# Patient Record
Sex: Male | Born: 1985 | Hispanic: No | Marital: Single | Smoking: Never smoker
Health system: Southern US, Community
[De-identification: ages and names within clinical notes are randomized; demographics above are authoritative.]

## PROBLEM LIST (undated history)

## (undated) DIAGNOSIS — K649 Unspecified hemorrhoids: Secondary | ICD-10-CM

## (undated) DIAGNOSIS — N200 Calculus of kidney: Secondary | ICD-10-CM

## (undated) DIAGNOSIS — Z87442 Personal history of urinary calculi: Secondary | ICD-10-CM

## (undated) DIAGNOSIS — G4730 Sleep apnea, unspecified: Secondary | ICD-10-CM

## (undated) DIAGNOSIS — M539 Dorsopathy, unspecified: Secondary | ICD-10-CM

## (undated) DIAGNOSIS — I10 Essential (primary) hypertension: Secondary | ICD-10-CM

## (undated) DIAGNOSIS — N183 Chronic kidney disease, stage 3 unspecified: Secondary | ICD-10-CM

## (undated) DIAGNOSIS — F329 Major depressive disorder, single episode, unspecified: Secondary | ICD-10-CM

## (undated) DIAGNOSIS — F909 Attention-deficit hyperactivity disorder, unspecified type: Secondary | ICD-10-CM

## (undated) DIAGNOSIS — M4316 Spondylolisthesis, lumbar region: Secondary | ICD-10-CM

## (undated) DIAGNOSIS — D649 Anemia, unspecified: Secondary | ICD-10-CM

## (undated) DIAGNOSIS — M533 Sacrococcygeal disorders, not elsewhere classified: Secondary | ICD-10-CM

## (undated) DIAGNOSIS — F32A Depression, unspecified: Secondary | ICD-10-CM

## (undated) DIAGNOSIS — M545 Low back pain, unspecified: Secondary | ICD-10-CM

## (undated) HISTORY — PX: MOLE REMOVAL: SHX2046

## (undated) HISTORY — DX: Calculus of kidney: N20.0

## (undated) HISTORY — DX: Dorsopathy, unspecified: M53.9

## (undated) HISTORY — PX: RHINOPLASTY: SUR1284

## (undated) HISTORY — PX: ORIF RADIUS & ULNA FRACTURES: SHX2129

## (undated) HISTORY — DX: Essential (primary) hypertension: I10

## (undated) HISTORY — PX: EXTRACORPOREAL SHOCK WAVE LITHOTRIPSY: SHX1557

---
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
## 2008-04-24 ENCOUNTER — Ambulatory Visit (HOSPITAL_COMMUNITY): Admission: RE | Admit: 2008-04-24 | Discharge: 2008-04-24 | Payer: Self-pay | Admitting: Orthopedic Surgery

## 2010-06-09 IMAGING — RF DG FLUORO GUIDE NDL PLC/BX
6 series · 6 of 6 positions shown · non-contrast
Comparison: No priors

CLINICAL DATA: Right shoulder pain - possible labral tear

FLUORO GUIDED NEEDLE PLACEMENT

[Series 1: run · 1 of 1 slices shown (1 of 6)]
[im 1/1]
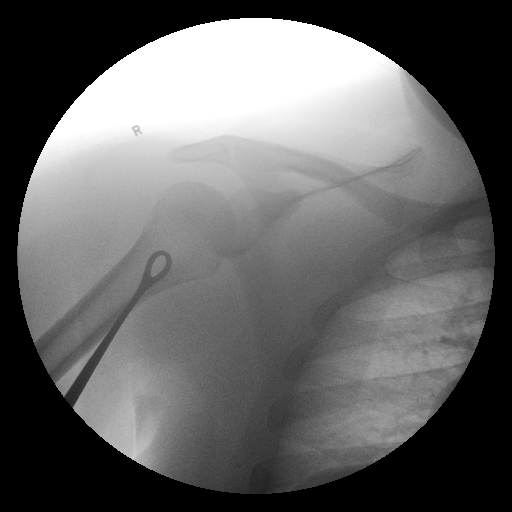

[Series 2: run · 1 of 1 slices shown (2 of 6)]
[im 1/1]
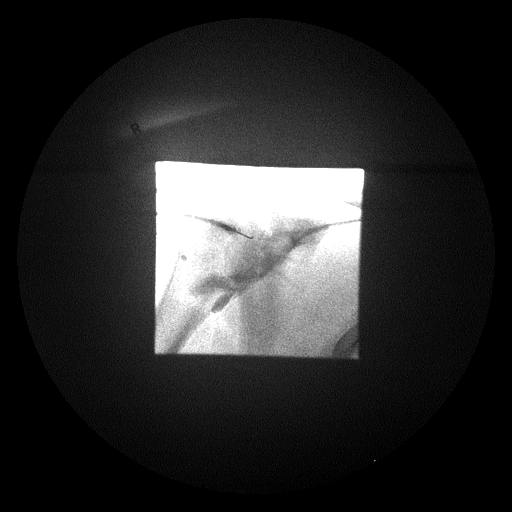

[Series 3: run · 1 of 1 slices shown (3 of 6)]
[im 1/1]
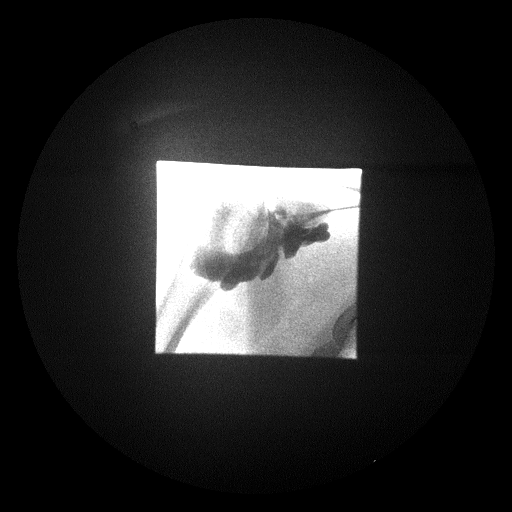

[Series 4: run · 1 of 1 slices shown (4 of 6)]
[im 1/1]
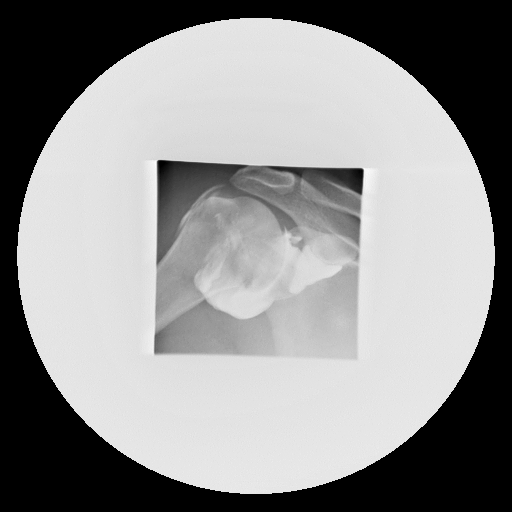

[Series 5: run · 1 of 1 slices shown (5 of 6)]
[im 1/1]
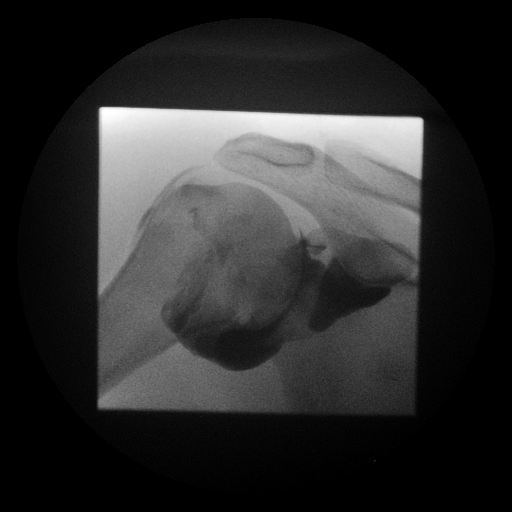

[Series 6: run · 1 of 1 slices shown (6 of 6)]
[im 1/1]
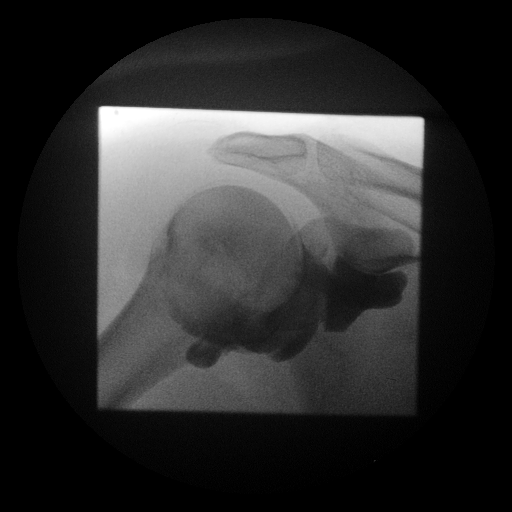

[6 of 6 positions shown; findings below may reference images not displayed]

FINDINGS: Under fluoroscopic guidance, the right shoulder was
injected for MRI.

Initially, the skin was prepped and draped in the usual fashion.
After local anesthesia, a 22 gauge spinal needle was advanced to
the right humeral head with fluoroscopic guidance.  Injection of a
small amount of contrast shows that the needle was not intra-
articular.  The needle was redirected more medially, achieving
intra-articular position, documented with contrast injection.

A total of approximately 10 ml of a mixture of Omnipaque and
lidocaine, containing 0.1 ml Gadolinium, was injected into the
joint.

Procedure well tolerated by the patient no immediate complications
encountered.
IMPRESSION: Right shoulder joint injection for MRI.

## 2010-11-01 HISTORY — PX: PILONIDAL CYST EXCISION: SHX744

## 2010-11-02 ENCOUNTER — Encounter (INDEPENDENT_AMBULATORY_CARE_PROVIDER_SITE_OTHER): Payer: PRIVATE HEALTH INSURANCE | Admitting: General Surgery

## 2010-11-02 ENCOUNTER — Telehealth (INDEPENDENT_AMBULATORY_CARE_PROVIDER_SITE_OTHER): Payer: Self-pay | Admitting: General Surgery

## 2010-11-02 NOTE — Telephone Encounter (Signed)
Please call patient @ [phone]. He had an appointment on 11/02/10 and was told he needed to reschedule. The first available appointment is in August and he feels that his appointment is post-op and should be sooner. Thank you.

## 2010-11-10 ENCOUNTER — Ambulatory Visit (INDEPENDENT_AMBULATORY_CARE_PROVIDER_SITE_OTHER): Payer: PRIVATE HEALTH INSURANCE | Admitting: General Surgery

## 2010-11-10 ENCOUNTER — Encounter (INDEPENDENT_AMBULATORY_CARE_PROVIDER_SITE_OTHER): Payer: Self-pay | Admitting: General Surgery

## 2010-11-10 DIAGNOSIS — L0591 Pilonidal cyst without abscess: Secondary | ICD-10-CM | POA: Insufficient documentation

## 2010-11-10 NOTE — Progress Notes (Signed)
He returns for another postoperative visit following extensive pilonidal cystectomy. He states there is a painful firm nodule at the superior aspect of the incision. He is wondering if he can go swimming in a pool.  Physical exam: The gluteal cleft wound is nearly completely healed and is very shallow and clean. There is a firm nodular scar present superiorly which looks like granulation tissue. I applied silver nitrate to this area and he tolerated it well  Assessment  :Wound healing well following extensive pilonidal cystectomy. Granulation tissue treated.  Plan: Continue current wound care. I think he going to assuming pool now. If the nodule is still bothers him in a couple of months call back for a return visit. Otherwise return p.r.n.

## 2011-03-01 DIAGNOSIS — I10 Essential (primary) hypertension: Secondary | ICD-10-CM | POA: Diagnosis present

## 2011-03-31 DIAGNOSIS — E785 Hyperlipidemia, unspecified: Secondary | ICD-10-CM | POA: Insufficient documentation

## 2012-05-03 DIAGNOSIS — G4733 Obstructive sleep apnea (adult) (pediatric): Secondary | ICD-10-CM

## 2012-05-03 HISTORY — DX: Obstructive sleep apnea (adult) (pediatric): G47.33

## 2012-10-31 DIAGNOSIS — Z9989 Dependence on other enabling machines and devices: Secondary | ICD-10-CM

## 2012-10-31 DIAGNOSIS — G4733 Obstructive sleep apnea (adult) (pediatric): Secondary | ICD-10-CM

## 2013-01-19 DIAGNOSIS — M545 Low back pain, unspecified: Secondary | ICD-10-CM | POA: Insufficient documentation

## 2014-10-16 DIAGNOSIS — F988 Other specified behavioral and emotional disorders with onset usually occurring in childhood and adolescence: Secondary | ICD-10-CM | POA: Insufficient documentation

## 2015-07-07 DIAGNOSIS — F411 Generalized anxiety disorder: Secondary | ICD-10-CM | POA: Insufficient documentation

## 2016-02-05 DIAGNOSIS — N200 Calculus of kidney: Secondary | ICD-10-CM | POA: Insufficient documentation

## 2016-02-05 DIAGNOSIS — K760 Fatty (change of) liver, not elsewhere classified: Secondary | ICD-10-CM | POA: Insufficient documentation

## 2017-03-18 HISTORY — PX: POSTERIOR LUMBAR FUSION: SHX6036

## 2017-06-14 ENCOUNTER — Encounter (INDEPENDENT_AMBULATORY_CARE_PROVIDER_SITE_OTHER): Payer: Self-pay | Admitting: Orthopaedic Surgery

## 2017-06-14 ENCOUNTER — Other Ambulatory Visit (INDEPENDENT_AMBULATORY_CARE_PROVIDER_SITE_OTHER): Payer: Self-pay | Admitting: Orthopaedic Surgery

## 2017-06-15 ENCOUNTER — Encounter (INDEPENDENT_AMBULATORY_CARE_PROVIDER_SITE_OTHER): Payer: Self-pay | Admitting: Orthopaedic Surgery

## 2018-05-19 DIAGNOSIS — M5126 Other intervertebral disc displacement, lumbar region: Secondary | ICD-10-CM | POA: Insufficient documentation

## 2018-07-25 ENCOUNTER — Other Ambulatory Visit (INDEPENDENT_AMBULATORY_CARE_PROVIDER_SITE_OTHER): Payer: Self-pay | Admitting: Orthopaedic Surgery

## 2019-08-20 DIAGNOSIS — I10 Essential (primary) hypertension: Secondary | ICD-10-CM | POA: Insufficient documentation

## 2019-08-20 DIAGNOSIS — Z981 Arthrodesis status: Secondary | ICD-10-CM | POA: Insufficient documentation

## 2019-08-20 DIAGNOSIS — F329 Major depressive disorder, single episode, unspecified: Secondary | ICD-10-CM | POA: Insufficient documentation

## 2019-08-20 DIAGNOSIS — G629 Polyneuropathy, unspecified: Secondary | ICD-10-CM | POA: Insufficient documentation

## 2019-08-20 DIAGNOSIS — F32A Depression, unspecified: Secondary | ICD-10-CM | POA: Insufficient documentation

## 2019-12-06 DIAGNOSIS — F3342 Major depressive disorder, recurrent, in full remission: Secondary | ICD-10-CM | POA: Diagnosis not present

## 2019-12-06 DIAGNOSIS — F902 Attention-deficit hyperactivity disorder, combined type: Secondary | ICD-10-CM | POA: Diagnosis not present

## 2019-12-11 DIAGNOSIS — G894 Chronic pain syndrome: Secondary | ICD-10-CM | POA: Diagnosis not present

## 2019-12-11 DIAGNOSIS — M542 Cervicalgia: Secondary | ICD-10-CM | POA: Diagnosis not present

## 2019-12-11 DIAGNOSIS — M4316 Spondylolisthesis, lumbar region: Secondary | ICD-10-CM | POA: Diagnosis not present

## 2019-12-11 DIAGNOSIS — M5416 Radiculopathy, lumbar region: Secondary | ICD-10-CM | POA: Diagnosis not present

## 2020-01-14 MED FILL — traMADol HCL 50 MG TABS: 50 | 30 days supply | Qty: 180 | Fill #0

## 2020-01-15 ENCOUNTER — Other Ambulatory Visit (HOSPITAL_BASED_OUTPATIENT_CLINIC_OR_DEPARTMENT_OTHER): Payer: Self-pay | Admitting: Internal Medicine

## 2020-01-15 MED FILL — LISINOPRIL 40 MG TABS: 40 | 90 days supply | Qty: 90 | Fill #0

## 2020-01-18 MED FILL — CONCERTA 36 MG TABLET ER: 36 | 30 days supply | Qty: 30 | Fill #0

## 2020-01-18 MED FILL — buPROPion HCL ER (XL) 300 M: 300 | 90 days supply | Qty: 90 | Fill #0

## 2020-01-18 MED FILL — METHYLPHENIDATE HCL 5 MG TA: 5 | 30 days supply | Qty: 30 | Fill #0

## 2020-02-28 DIAGNOSIS — F3342 Major depressive disorder, recurrent, in full remission: Secondary | ICD-10-CM | POA: Diagnosis not present

## 2020-02-28 DIAGNOSIS — F902 Attention-deficit hyperactivity disorder, combined type: Secondary | ICD-10-CM | POA: Diagnosis not present

## 2020-02-29 ENCOUNTER — Other Ambulatory Visit (HOSPITAL_BASED_OUTPATIENT_CLINIC_OR_DEPARTMENT_OTHER): Payer: Self-pay | Admitting: Psychiatry

## 2020-02-29 MED FILL — METHYLPHENIDATE HCL 5 MG TA: 5 | 30 days supply | Qty: 30 | Fill #0

## 2020-02-29 MED FILL — CONCERTA 36 MG TABLET ER: 36 | 30 days supply | Qty: 30 | Fill #0

## 2020-03-02 DIAGNOSIS — R110 Nausea: Secondary | ICD-10-CM | POA: Diagnosis not present

## 2020-03-02 DIAGNOSIS — R160 Hepatomegaly, not elsewhere classified: Secondary | ICD-10-CM | POA: Diagnosis not present

## 2020-03-02 DIAGNOSIS — N200 Calculus of kidney: Secondary | ICD-10-CM | POA: Diagnosis not present

## 2020-03-02 DIAGNOSIS — K760 Fatty (change of) liver, not elsewhere classified: Secondary | ICD-10-CM | POA: Diagnosis not present

## 2020-03-02 DIAGNOSIS — R10813 Right lower quadrant abdominal tenderness: Secondary | ICD-10-CM | POA: Diagnosis not present

## 2020-03-11 ENCOUNTER — Other Ambulatory Visit (HOSPITAL_BASED_OUTPATIENT_CLINIC_OR_DEPARTMENT_OTHER): Payer: Self-pay | Admitting: Physician Assistant

## 2020-03-11 DIAGNOSIS — M4316 Spondylolisthesis, lumbar region: Secondary | ICD-10-CM | POA: Diagnosis not present

## 2020-03-11 DIAGNOSIS — G894 Chronic pain syndrome: Secondary | ICD-10-CM | POA: Diagnosis not present

## 2020-03-11 DIAGNOSIS — Z5181 Encounter for therapeutic drug level monitoring: Secondary | ICD-10-CM | POA: Diagnosis not present

## 2020-03-11 DIAGNOSIS — M542 Cervicalgia: Secondary | ICD-10-CM | POA: Diagnosis not present

## 2020-03-11 DIAGNOSIS — Z79899 Other long term (current) drug therapy: Secondary | ICD-10-CM | POA: Diagnosis not present

## 2020-03-11 DIAGNOSIS — M961 Postlaminectomy syndrome, not elsewhere classified: Secondary | ICD-10-CM | POA: Diagnosis not present

## 2020-03-11 MED FILL — tiZANidine HCL 4 MG TABS: 4 | 30 days supply | Qty: 90 | Fill #0

## 2020-03-11 MED FILL — traMADol HCL 50 MG TABS: 50 | 30 days supply | Qty: 180 | Fill #0

## 2020-03-11 MED FILL — GABAPENTIN 300 MG CAPSULE: 300 | 90 days supply | Qty: 270 | Fill #0

## 2020-03-11 MED FILL — HYDROCODON-APAP 5-325: 5-325 | 15 days supply | Qty: 15 | Fill #0

## 2020-03-18 DIAGNOSIS — N200 Calculus of kidney: Secondary | ICD-10-CM | POA: Diagnosis not present

## 2020-03-18 MED FILL — CONCERTA 36 MG TABLET ER: 36 | 30 days supply | Qty: 30 | Fill #0

## 2020-03-25 DIAGNOSIS — N200 Calculus of kidney: Secondary | ICD-10-CM | POA: Diagnosis not present

## 2020-04-01 MED FILL — FENOFIBRATE 160 MG TABLET: 160 | 90 days supply | Qty: 90 | Fill #0

## 2020-04-14 MED FILL — traMADol HCL 50 MG TABS: 50 | 30 days supply | Qty: 180 | Fill #1

## 2020-04-14 MED FILL — CONCERTA 36 MG TABLET ER: 36 | 30 days supply | Qty: 30 | Fill #0

## 2020-04-14 MED FILL — tiZANidine HCL 4 MG TABS: 4 | 30 days supply | Qty: 90 | Fill #1

## 2020-04-14 MED FILL — LISINOPRIL 40 MG TABS: 40 | 90 days supply | Qty: 90 | Fill #1

## 2020-05-02 ENCOUNTER — Other Ambulatory Visit (HOSPITAL_BASED_OUTPATIENT_CLINIC_OR_DEPARTMENT_OTHER): Payer: Self-pay

## 2020-05-02 MED FILL — buPROPion HCL ER (XL) 300 M: 300 | 90 days supply | Qty: 90 | Fill #0

## 2020-05-22 ENCOUNTER — Other Ambulatory Visit (HOSPITAL_BASED_OUTPATIENT_CLINIC_OR_DEPARTMENT_OTHER): Payer: Self-pay

## 2020-05-22 DIAGNOSIS — F902 Attention-deficit hyperactivity disorder, combined type: Secondary | ICD-10-CM | POA: Diagnosis not present

## 2020-05-22 DIAGNOSIS — F3342 Major depressive disorder, recurrent, in full remission: Secondary | ICD-10-CM | POA: Diagnosis not present

## 2020-05-22 MED FILL — traMADol HCL 50 MG TABS: 50 | 30 days supply | Qty: 180 | Fill #2

## 2020-05-22 MED FILL — tiZANidine HCL 4 MG TABS: 4 | 30 days supply | Qty: 90 | Fill #2

## 2020-05-23 ENCOUNTER — Other Ambulatory Visit (HOSPITAL_BASED_OUTPATIENT_CLINIC_OR_DEPARTMENT_OTHER): Payer: Self-pay | Admitting: Psychiatry

## 2020-05-23 MED FILL — METHYLPHENIDATE HCL 5 MG TA: 5 | 30 days supply | Qty: 30 | Fill #0

## 2020-05-23 MED FILL — CONCERTA 36 MG TABLET ER: 36 | 30 days supply | Qty: 30 | Fill #0

## 2020-06-06 ENCOUNTER — Other Ambulatory Visit (HOSPITAL_BASED_OUTPATIENT_CLINIC_OR_DEPARTMENT_OTHER): Payer: Self-pay | Admitting: Physician Assistant

## 2020-06-06 DIAGNOSIS — M961 Postlaminectomy syndrome, not elsewhere classified: Secondary | ICD-10-CM | POA: Diagnosis not present

## 2020-06-06 DIAGNOSIS — Z981 Arthrodesis status: Secondary | ICD-10-CM | POA: Diagnosis not present

## 2020-06-06 DIAGNOSIS — M5416 Radiculopathy, lumbar region: Secondary | ICD-10-CM | POA: Diagnosis not present

## 2020-06-06 DIAGNOSIS — M542 Cervicalgia: Secondary | ICD-10-CM | POA: Diagnosis not present

## 2020-06-06 DIAGNOSIS — M4316 Spondylolisthesis, lumbar region: Secondary | ICD-10-CM | POA: Diagnosis not present

## 2020-06-06 DIAGNOSIS — M533 Sacrococcygeal disorders, not elsewhere classified: Secondary | ICD-10-CM | POA: Diagnosis not present

## 2020-06-06 MED FILL — HYDROCODON-APAP 5-325: 5-325 | 15 days supply | Qty: 15 | Fill #0

## 2020-06-16 MED FILL — GABAPENTIN 300 MG CAPSULE: 300 | 90 days supply | Qty: 270 | Fill #1

## 2020-06-20 MED FILL — tiZANidine HCL 4 MG TABS: 4 | 30 days supply | Qty: 90 | Fill #0

## 2020-06-20 MED FILL — traMADol HCL 50 MG TABS: 50 | 30 days supply | Qty: 180 | Fill #0

## 2020-06-30 MED FILL — CONCERTA 36 MG TABLET ER: 36 | 30 days supply | Qty: 30 | Fill #0

## 2020-07-03 ENCOUNTER — Other Ambulatory Visit (HOSPITAL_BASED_OUTPATIENT_CLINIC_OR_DEPARTMENT_OTHER): Payer: Self-pay | Admitting: Internal Medicine

## 2020-07-03 MED FILL — FENOFIBRATE 160 MG TABLET: 160 | 30 days supply | Qty: 30 | Fill #0

## 2020-07-28 MED FILL — CONCERTA 36 MG TABLET ER: 36 | 30 days supply | Qty: 30 | Fill #0

## 2020-07-28 MED FILL — traMADol HCL 50 MG TABS: 50 | 30 days supply | Qty: 180 | Fill #1

## 2020-07-28 MED FILL — buPROPion HCL ER (XL) 300 M: 300 | 90 days supply | Qty: 90 | Fill #0

## 2020-07-28 MED FILL — tiZANidine HCL 4 MG TABS: 4 | 30 days supply | Qty: 90 | Fill #1

## 2020-08-05 ENCOUNTER — Other Ambulatory Visit (HOSPITAL_BASED_OUTPATIENT_CLINIC_OR_DEPARTMENT_OTHER): Payer: Self-pay

## 2020-08-05 DIAGNOSIS — R7309 Other abnormal glucose: Secondary | ICD-10-CM | POA: Diagnosis not present

## 2020-08-05 DIAGNOSIS — E782 Mixed hyperlipidemia: Secondary | ICD-10-CM | POA: Diagnosis not present

## 2020-08-05 DIAGNOSIS — Z0000 Encounter for general adult medical examination without abnormal findings: Secondary | ICD-10-CM | POA: Diagnosis not present

## 2020-08-05 DIAGNOSIS — Z6841 Body Mass Index (BMI) 40.0 and over, adult: Secondary | ICD-10-CM | POA: Diagnosis not present

## 2020-08-05 DIAGNOSIS — I10 Essential (primary) hypertension: Secondary | ICD-10-CM | POA: Diagnosis not present

## 2020-08-05 DIAGNOSIS — K760 Fatty (change of) liver, not elsewhere classified: Secondary | ICD-10-CM | POA: Diagnosis not present

## 2020-08-05 MED ORDER — HYDROCHLOROTHIAZIDE 25 MG PO TABS
ORAL_TABLET | ORAL | 5 refills | Status: DC
  Filled 2020-08-05: qty 30, 30d supply, fill #0
  Filled 2020-09-30: qty 30, 30d supply, fill #1
  Filled 2020-12-01: qty 30, 30d supply, fill #2

## 2020-08-11 ENCOUNTER — Other Ambulatory Visit (HOSPITAL_BASED_OUTPATIENT_CLINIC_OR_DEPARTMENT_OTHER): Payer: Self-pay

## 2020-08-12 ENCOUNTER — Other Ambulatory Visit (HOSPITAL_BASED_OUTPATIENT_CLINIC_OR_DEPARTMENT_OTHER): Payer: Self-pay

## 2020-08-12 DIAGNOSIS — F3342 Major depressive disorder, recurrent, in full remission: Secondary | ICD-10-CM | POA: Diagnosis not present

## 2020-08-12 DIAGNOSIS — F902 Attention-deficit hyperactivity disorder, combined type: Secondary | ICD-10-CM | POA: Diagnosis not present

## 2020-08-12 MED ORDER — FENOFIBRATE 160 MG PO TABS
ORAL_TABLET | ORAL | 3 refills | Status: DC
  Filled 2020-08-12: qty 90, 90d supply, fill #0
  Filled 2020-11-13: qty 90, 90d supply, fill #1

## 2020-08-13 ENCOUNTER — Other Ambulatory Visit (HOSPITAL_BASED_OUTPATIENT_CLINIC_OR_DEPARTMENT_OTHER): Payer: Self-pay

## 2020-08-13 MED ORDER — METHYLPHENIDATE HCL ER (OSM) 36 MG PO TBCR: EXTENDED_RELEASE_TABLET | ORAL | 0 refills | Status: DC

## 2020-08-13 MED ORDER — BUPROPION HCL ER (XL) 300 MG PO TB24
ORAL_TABLET | ORAL | 1 refills | Status: DC
  Filled 2020-08-13: qty 90, 90d supply, fill #0

## 2020-08-13 MED ORDER — METHYLPHENIDATE HCL ER (OSM) 36 MG PO TBCR
EXTENDED_RELEASE_TABLET | ORAL | 0 refills | Status: DC
  Filled 2020-10-09 – 2020-10-21 (×2): qty 30, 30d supply, fill #0

## 2020-08-13 MED ORDER — METHYLPHENIDATE HCL ER (OSM) 36 MG PO TBCR
EXTENDED_RELEASE_TABLET | ORAL | 0 refills | Status: DC
  Filled 2020-08-13 – 2020-12-01 (×2): qty 30, 30d supply, fill #0

## 2020-08-27 ENCOUNTER — Other Ambulatory Visit (HOSPITAL_BASED_OUTPATIENT_CLINIC_OR_DEPARTMENT_OTHER): Payer: Self-pay

## 2020-08-31 HISTORY — PX: COLONOSCOPY: SHX174

## 2020-09-03 ENCOUNTER — Other Ambulatory Visit (HOSPITAL_BASED_OUTPATIENT_CLINIC_OR_DEPARTMENT_OTHER): Payer: Self-pay

## 2020-09-03 DIAGNOSIS — M5441 Lumbago with sciatica, right side: Secondary | ICD-10-CM | POA: Diagnosis not present

## 2020-09-03 DIAGNOSIS — M5416 Radiculopathy, lumbar region: Secondary | ICD-10-CM | POA: Diagnosis not present

## 2020-09-03 DIAGNOSIS — Z79899 Other long term (current) drug therapy: Secondary | ICD-10-CM | POA: Diagnosis not present

## 2020-09-03 DIAGNOSIS — M4316 Spondylolisthesis, lumbar region: Secondary | ICD-10-CM | POA: Diagnosis not present

## 2020-09-03 DIAGNOSIS — M542 Cervicalgia: Secondary | ICD-10-CM | POA: Diagnosis not present

## 2020-09-03 DIAGNOSIS — M533 Sacrococcygeal disorders, not elsewhere classified: Secondary | ICD-10-CM | POA: Diagnosis not present

## 2020-09-03 DIAGNOSIS — Z5181 Encounter for therapeutic drug level monitoring: Secondary | ICD-10-CM | POA: Diagnosis not present

## 2020-09-03 DIAGNOSIS — Z981 Arthrodesis status: Secondary | ICD-10-CM | POA: Diagnosis not present

## 2020-09-03 DIAGNOSIS — G8929 Other chronic pain: Secondary | ICD-10-CM | POA: Diagnosis not present

## 2020-09-03 DIAGNOSIS — M961 Postlaminectomy syndrome, not elsewhere classified: Secondary | ICD-10-CM | POA: Diagnosis not present

## 2020-09-03 MED ORDER — HYDROCODONE-ACETAMINOPHEN 5-325 MG PO TABS
ORAL_TABLET | ORAL | 0 refills | Status: DC
  Filled 2020-09-03: qty 15, 30d supply, fill #0

## 2020-09-03 MED ORDER — TIZANIDINE HCL 4 MG PO TABS
ORAL_TABLET | ORAL | 2 refills | Status: DC
  Filled 2020-09-03: qty 90, 30d supply, fill #0
  Filled 2020-12-01: qty 90, 30d supply, fill #1

## 2020-09-03 MED ORDER — TRAMADOL HCL 50 MG PO TABS
ORAL_TABLET | ORAL | 2 refills | Status: DC
  Filled 2020-09-03: qty 180, 30d supply, fill #0
  Filled 2020-10-21: qty 180, 30d supply, fill #1
  Filled 2020-12-01: qty 180, 30d supply, fill #2

## 2020-09-03 MED ORDER — GABAPENTIN 300 MG PO CAPS
ORAL_CAPSULE | ORAL | 2 refills | Status: DC
  Filled 2020-09-03: qty 270, 90d supply, fill #0
  Filled 2020-12-01: qty 270, 90d supply, fill #1

## 2020-09-04 ENCOUNTER — Other Ambulatory Visit (HOSPITAL_BASED_OUTPATIENT_CLINIC_OR_DEPARTMENT_OTHER): Payer: Self-pay

## 2020-09-11 ENCOUNTER — Other Ambulatory Visit (HOSPITAL_BASED_OUTPATIENT_CLINIC_OR_DEPARTMENT_OTHER): Payer: Self-pay

## 2020-09-11 MED ORDER — HYDROCORTISONE ACETATE 25 MG RE SUPP
RECTAL | 1 refills | Status: DC
  Filled 2020-09-11: qty 12, 6d supply, fill #0

## 2020-09-11 MED FILL — Methylphenidate HCl Tab ER Osmotic Release (OSM) 36 MG: ORAL | 30 days supply | Qty: 30 | Fill #0 | Status: AC

## 2020-09-18 DIAGNOSIS — K640 First degree hemorrhoids: Secondary | ICD-10-CM | POA: Diagnosis not present

## 2020-09-18 DIAGNOSIS — J029 Acute pharyngitis, unspecified: Secondary | ICD-10-CM | POA: Diagnosis not present

## 2020-09-20 DIAGNOSIS — J069 Acute upper respiratory infection, unspecified: Secondary | ICD-10-CM | POA: Diagnosis not present

## 2020-09-20 DIAGNOSIS — L304 Erythema intertrigo: Secondary | ICD-10-CM | POA: Diagnosis not present

## 2020-09-22 DIAGNOSIS — M5417 Radiculopathy, lumbosacral region: Secondary | ICD-10-CM | POA: Diagnosis not present

## 2020-09-22 DIAGNOSIS — Z3189 Encounter for other procreative management: Secondary | ICD-10-CM | POA: Diagnosis not present

## 2020-09-25 DIAGNOSIS — G8929 Other chronic pain: Secondary | ICD-10-CM | POA: Diagnosis not present

## 2020-09-25 DIAGNOSIS — Z981 Arthrodesis status: Secondary | ICD-10-CM | POA: Diagnosis not present

## 2020-09-25 DIAGNOSIS — M545 Low back pain, unspecified: Secondary | ICD-10-CM | POA: Diagnosis not present

## 2020-09-27 DIAGNOSIS — K644 Residual hemorrhoidal skin tags: Secondary | ICD-10-CM | POA: Diagnosis not present

## 2020-09-27 DIAGNOSIS — K648 Other hemorrhoids: Secondary | ICD-10-CM | POA: Diagnosis not present

## 2020-09-30 ENCOUNTER — Other Ambulatory Visit (HOSPITAL_BASED_OUTPATIENT_CLINIC_OR_DEPARTMENT_OTHER): Payer: Self-pay

## 2020-10-06 ENCOUNTER — Other Ambulatory Visit (HOSPITAL_BASED_OUTPATIENT_CLINIC_OR_DEPARTMENT_OTHER): Payer: Self-pay

## 2020-10-06 MED FILL — Lisinopril Tab 40 MG: ORAL | 60 days supply | Qty: 60 | Fill #0 | Status: AC

## 2020-10-09 ENCOUNTER — Other Ambulatory Visit (HOSPITAL_BASED_OUTPATIENT_CLINIC_OR_DEPARTMENT_OTHER): Payer: Self-pay

## 2020-10-17 ENCOUNTER — Other Ambulatory Visit (HOSPITAL_BASED_OUTPATIENT_CLINIC_OR_DEPARTMENT_OTHER): Payer: Self-pay

## 2020-10-21 ENCOUNTER — Other Ambulatory Visit (HOSPITAL_BASED_OUTPATIENT_CLINIC_OR_DEPARTMENT_OTHER): Payer: Self-pay

## 2020-10-21 MED FILL — Tizanidine HCl Tab 4 MG (Base Equivalent): ORAL | 30 days supply | Qty: 90 | Fill #0 | Status: AC

## 2020-10-27 DIAGNOSIS — F3342 Major depressive disorder, recurrent, in full remission: Secondary | ICD-10-CM | POA: Diagnosis not present

## 2020-10-27 DIAGNOSIS — F902 Attention-deficit hyperactivity disorder, combined type: Secondary | ICD-10-CM | POA: Diagnosis not present

## 2020-10-28 ENCOUNTER — Other Ambulatory Visit (HOSPITAL_BASED_OUTPATIENT_CLINIC_OR_DEPARTMENT_OTHER): Payer: Self-pay

## 2020-10-28 MED ORDER — METHYLPHENIDATE HCL ER (OSM) 36 MG PO TBCR
EXTENDED_RELEASE_TABLET | ORAL | 0 refills | Status: DC
  Filled 2020-10-28: qty 30, 30d supply, fill #0

## 2020-10-28 MED ORDER — METHYLPHENIDATE HCL ER (OSM) 36 MG PO TBCR: EXTENDED_RELEASE_TABLET | ORAL | 0 refills | Status: DC

## 2020-10-28 MED ORDER — BUPROPION HCL ER (XL) 300 MG PO TB24
ORAL_TABLET | ORAL | 1 refills | Status: DC
  Filled 2020-10-28: qty 90, 90d supply, fill #0

## 2020-10-28 MED ORDER — METHYLPHENIDATE HCL 5 MG PO TABS: ORAL_TABLET | ORAL | 0 refills | Status: DC

## 2020-10-31 DIAGNOSIS — K6289 Other specified diseases of anus and rectum: Secondary | ICD-10-CM | POA: Diagnosis not present

## 2020-10-31 DIAGNOSIS — K649 Unspecified hemorrhoids: Secondary | ICD-10-CM | POA: Diagnosis not present

## 2020-10-31 DIAGNOSIS — K625 Hemorrhage of anus and rectum: Secondary | ICD-10-CM | POA: Diagnosis not present

## 2020-11-06 DIAGNOSIS — Z6841 Body Mass Index (BMI) 40.0 and over, adult: Secondary | ICD-10-CM | POA: Diagnosis not present

## 2020-11-06 DIAGNOSIS — K602 Anal fissure, unspecified: Secondary | ICD-10-CM | POA: Diagnosis not present

## 2020-11-13 ENCOUNTER — Other Ambulatory Visit (HOSPITAL_BASED_OUTPATIENT_CLINIC_OR_DEPARTMENT_OTHER): Payer: Self-pay

## 2020-12-01 ENCOUNTER — Other Ambulatory Visit (HOSPITAL_BASED_OUTPATIENT_CLINIC_OR_DEPARTMENT_OTHER): Payer: Self-pay

## 2020-12-03 ENCOUNTER — Other Ambulatory Visit (HOSPITAL_BASED_OUTPATIENT_CLINIC_OR_DEPARTMENT_OTHER): Payer: Self-pay

## 2020-12-03 DIAGNOSIS — M5416 Radiculopathy, lumbar region: Secondary | ICD-10-CM | POA: Diagnosis not present

## 2020-12-03 DIAGNOSIS — M961 Postlaminectomy syndrome, not elsewhere classified: Secondary | ICD-10-CM | POA: Diagnosis not present

## 2020-12-03 MED ORDER — TRAMADOL HCL 50 MG PO TABS
ORAL_TABLET | ORAL | 2 refills | Status: DC
  Filled 2020-12-03: qty 180, 30d supply, fill #0

## 2020-12-03 MED ORDER — GABAPENTIN 300 MG PO CAPS
ORAL_CAPSULE | ORAL | 2 refills | Status: DC
  Filled 2020-12-03: qty 270, 90d supply, fill #0

## 2020-12-03 MED ORDER — TIZANIDINE HCL 4 MG PO TABS
ORAL_TABLET | ORAL | 2 refills | Status: DC
  Filled 2020-12-03: qty 90, 30d supply, fill #0

## 2020-12-03 MED ORDER — LIDOCAINE 5 % EX PTCH
MEDICATED_PATCH | CUTANEOUS | 2 refills | Status: DC
  Filled 2020-12-03: qty 30, 30d supply, fill #0

## 2020-12-03 MED ORDER — HYDROCODONE-ACETAMINOPHEN 5-325 MG PO TABS
ORAL_TABLET | ORAL | 0 refills | Status: DC
  Filled 2020-12-03: qty 15, 15d supply, fill #0

## 2020-12-09 ENCOUNTER — Other Ambulatory Visit (HOSPITAL_BASED_OUTPATIENT_CLINIC_OR_DEPARTMENT_OTHER): Payer: Self-pay

## 2020-12-10 ENCOUNTER — Other Ambulatory Visit (HOSPITAL_BASED_OUTPATIENT_CLINIC_OR_DEPARTMENT_OTHER): Payer: Self-pay

## 2020-12-11 ENCOUNTER — Other Ambulatory Visit (HOSPITAL_BASED_OUTPATIENT_CLINIC_OR_DEPARTMENT_OTHER): Payer: Self-pay

## 2020-12-12 ENCOUNTER — Other Ambulatory Visit (HOSPITAL_BASED_OUTPATIENT_CLINIC_OR_DEPARTMENT_OTHER): Payer: Self-pay

## 2020-12-12 MED ORDER — LISINOPRIL 40 MG PO TABS
ORAL_TABLET | ORAL | 2 refills | Status: DC
  Filled 2020-12-12: qty 90, 90d supply, fill #0

## 2020-12-15 ENCOUNTER — Other Ambulatory Visit (HOSPITAL_BASED_OUTPATIENT_CLINIC_OR_DEPARTMENT_OTHER): Payer: Self-pay

## 2020-12-18 DIAGNOSIS — K602 Anal fissure, unspecified: Secondary | ICD-10-CM | POA: Diagnosis not present

## 2021-01-03 ENCOUNTER — Emergency Department
Admission: EM | Admit: 2021-01-03 | Discharge: 2021-01-03 | Disposition: A | Discharge status: Home / Self Care | Payer: Self-pay | Source: Home / Self Care | Attending: Family Medicine | Admitting: Family Medicine

## 2021-01-03 ENCOUNTER — Ambulatory Visit: Payer: Self-pay

## 2021-01-03 ENCOUNTER — Other Ambulatory Visit: Payer: Self-pay

## 2021-01-03 DIAGNOSIS — L0501 Pilonidal cyst with abscess: Secondary | ICD-10-CM

## 2021-01-03 MED ORDER — CIPROFLOXACIN HCL 500 MG PO TABS: 500.0000 mg | ORAL_TABLET | Freq: Two times a day (BID) | ORAL | 0 refills | Status: DC

## 2021-01-03 MED ORDER — METRONIDAZOLE 500 MG PO TABS
500.0000 mg | ORAL_TABLET | Freq: Two times a day (BID) | ORAL | 0 refills | Status: DC
Start: 2021-01-03 — End: 2021-01-08

## 2021-01-03 NOTE — ED Triage Notes (Signed)
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

## 2021-01-03 NOTE — Discharge Instructions (Addendum)
You need to take 2 antibiotics.  Cipro 500 mg, and metronidazole 500 mg each twice a day.  Take 2 doses today Do not take tizanidine (muscle relaxer) while on Cipro Warm compresses to the area may help.  Warmth for 20 minutes every couple hours over the weekend Call or return for problems.

## 2021-01-03 NOTE — ED Provider Notes (Signed)
[facility]    CSN: [id number] Arrival date & time: 01/03/21  0946      History   Chief Complaint Chief Complaint  Patient presents with   Abscess    Left Gluteal Hold    HPI Hunter [name] is a 35 y.o. male.   HPI Patient has a history of having a pilonidal abscess, the cyst was removed.  He is here today for a recurring problem at the top of his gluteal cleft, on the right.  There is redness swelling and pain.  He is here for evaluation.  No fever or chills.  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 Authorizing Provider  ciprofloxacin (CIPRO) 500 MG tablet Take 1 tablet (500 mg total) by mouth 2 (two) times daily. 01/03/21  Yes Eustace Moore, MD  metroNIDAZOLE (FLAGYL) 500 MG tablet Take 1 tablet (500 mg total) by mouth 2 (two) times daily. 01/03/21  Yes Eustace Moore, MD  buPROPion (WELLBUTRIN XL) 300 MG 24 hr tablet TAKE 1 TABLET BY MOUTH EVERY MORNING 05/22/20 05/22/21    buPROPion (WELLBUTRIN XL) 300 MG 24 hr tablet TAKE 1 TABLET BY MOUTH EVERY MORNING 05/02/20 05/02/21    buPROPion (WELLBUTRIN XL) 300 MG 24 hr tablet Take one tablet by mouth once daily in the morning. 08/12/20     buPROPion (WELLBUTRIN XL) 300 MG 24 hr tablet Take 1 tablet by mouth every morning 10/27/20     fenofibrate 160 MG tablet TAKE 1 TABLET BY MOUTH ONCE DAILY **NEED OFFICE VISIT FOR FURTHER REFILLS** 07/03/20 07/03/21  Ardith Dark, MD  fenofibrate 160 MG tablet Take one tablet (160 mg dose) by mouth daily. 08/12/20     gabapentin (NEURONTIN) 300 MG capsule TAKE 1 CAPSULE BY MOUTH THREE  TIMES DAILY 03/11/20 03/11/21  Thad Ranger, Gaye Alken, PA-C  gabapentin (NEURONTIN) 300 MG capsule Take 1 capsule (300 mg dose) by mouth 3 (three) times a day. 09/03/20     gabapentin (NEURONTIN) 300 MG capsule Take one capsule (300 mg dose) by mouth 3 (three) times a day. 12/03/20     glucosamine-chondroitin 500-400 MG tablet Take 1 tablet by mouth daily.      [provider]  hydrochlorothiazide (HYDRODIURIL) 25 MG tablet Take one tablet (25 mg dose) by mouth in the morning. 08/05/20     HYDROcodone-acetaminophen (NORCO/VICODIN) 5-325 MG tablet Take one tablet by mouth at bedtime as needed for severe pain only for up to 30 days. 12/03/20     lidocaine (LIDODERM) 5 % Wear patch for up to 12 hours then remove and discard patch or as directed by MD 12/03/20     lisinopril (PRINIVIL,ZESTRIL) 40 MG tablet Take 40 mg by mouth daily.      [provider]  lisinopril (ZESTRIL) 40 MG tablet TAKE 1 TABLET BY MOUTH ONCE DAILY 01/15/20 01/14/21  Ardith Dark, MD  lisinopril (ZESTRIL) 40 MG tablet Take one tablet (40 mg dose) by mouth daily. 12/12/20  methylphenidate (CONCERTA) 36 MG PO CR tablet Take 1 tablet by mouth every morning 10/27/20     methylphenidate (CONCERTA) 36 MG PO CR tablet Take 1 tablet by mouth every morning 11/27/20     methylphenidate (CONCERTA) 36 MG PO CR tablet Take 1 tablet by mouth every morning 12/27/20     methylphenidate (RITALIN) 5 MG tablet TAKE 1 TABLET BY MOUTH EVERY EVENING AS NEEDED FOR EXTENDED FOCUS 05/23/20 11/19/20  Cherly Hensen, MD  methylphenidate (RITALIN) 5 MG tablet TAKE 1 TABLET BY MOUTH EVERY EVENING AS NEEDED FOR EXTENDED FOCUS **DO NOT FILL SOONER THAN 60 DAYS FROM 05/23/20** 05/23/20 11/19/20  Cherly Hensen, MD  methylphenidate (RITALIN) 5 MG tablet TAKE 1 TABLET BY MOUTH EVERY EVENING AS NEEDED FOR EXTENDED FOCUS 05/23/20 11/19/20  Cherly Hensen, MD  methylphenidate (RITALIN) 5 MG tablet Take 1 tablet by mouth every evening as needed for extended  focus 12/27/20     methylphenidate 36 MG PO CR tablet TAKE 1 TABLET BY MOUTH EVERY MORNING 05/23/20 11/19/20  Cherly Hensen, MD  methylphenidate 36 MG PO CR tablet TAKE 1 TABLET BY MOUTH EVERY MORNING 05/23/20 11/19/20  Cherly Hensen, MD  methylphenidate 36 MG PO CR tablet TAKE 1 TABLET BY MOUTH EVERY MORNING **FILL 60 DAYS AFTER 05/23/20** 05/23/20 11/19/20  Cherly Hensen, MD  methylphenidate 36 MG PO CR tablet TAKE 1 TABLET BY MOUTH EVERY MORNING 02/29/20 08/27/20  Cherly Hensen, MD  methylphenidate 36 MG PO CR tablet TAKE 1 TABLET BY MOUTH EVERY MORNING 02/29/20 08/27/20  Cherly Hensen, MD  methylphenidate 36 MG PO CR tablet TAKE 1 TABLET BY MOUTH EVERY MORNING 02/29/20 08/27/20  Cherly Hensen, MD  methylphenidate 36 MG PO CR tablet Take one tablet by mouth daily. 08/12/20     methylphenidate 36 MG PO CR tablet Take one tablet by mouth daily. 08/12/20     methylphenidate 36 MG PO CR tablet Take one tablet by mouth every morning. 08/12/20     Multiple Vitamin (MULTIVITAMIN) capsule Take 1 capsule by mouth daily.      [provider]  tiZANidine (ZANAFLEX) 4 MG tablet TAKE 1 TABLET BY MOUTH EVERY 8 HOURS AS NEEDED 06/06/20 06/06/21  Thad Ranger, Gaye Alken, PA-C  tiZANidine (ZANAFLEX) 4 MG tablet TAKE 1 TABLET BY MOUTH EVERY 8 HOURS AS NEEDED 03/11/20 03/11/21  Thad Ranger, Gaye Alken, PA-C  tiZANidine (ZANAFLEX) 4 MG tablet Take one tablet (4 mg dose) by mouth every 8 (eight) hours as needed. 09/03/20     tiZANidine (ZANAFLEX) 4 MG tablet Take one tablet (4 mg dose) by mouth every 8 (eight) hours as needed. 12/03/20     traMADol (ULTRAM) 50 MG tablet Take 1 to 2 tablets by mouth 3 times daily as needed for severe pain 09/03/20     traMADol (ULTRAM) 50 MG tablet Take 1 to 2 tablets by mouth (50-100 mg dose) by mouth up to 3 (three) times a day as needed for severe pain. 12/03/20       Family History Family History  Problem Relation Age of Onset   Hypertension Mother    Cancer  Mother        cancer    Social History Social History   Tobacco Use   Smoking status: Never   Smokeless tobacco: Never  Vaping Use   Vaping Use: Never used  Substance Use Topics   Alcohol use: Yes   Drug use: No     Allergies  Patient has no known allergies.   Review of Systems Review of Systems See HPI  Physical Exam Triage Vital Signs ED Triage Vitals  Enc Vitals Group     BP 01/03/21 1002 112/72     Pulse Rate 01/03/21 1002 99     Resp 01/03/21 1002 16     Temp 01/03/21 1002 99.6 F (37.6 C)     Temp Source 01/03/21 1002 Oral     SpO2 01/03/21 1002 97 %     Weight 01/03/21 1005 (!) 342 lb (155.1 kg)     Height 01/03/21 1005 5\' 10"  (1.778 m)     Head Circumference --      Peak Flow --      Pain Score 01/03/21 1005 2     Pain Loc --      Pain Edu? --      Excl. in GC? --    No data found.  Updated Vital Signs BP 112/72 (BP Location: Left Arm)   Pulse 99   Temp 99.6 F (37.6 C) (Oral)   Resp 16   Ht 5\' 10"  (1.778 m)   Wt (!) 155.1 kg   SpO2 97%   BMI 49.07 kg/m     Physical Exam Constitutional:      General: He is not in acute distress.    Appearance: He is well-developed.  HENT:     Head: Normocephalic and atraumatic.  Eyes:     Conjunctiva/sclera: Conjunctivae normal.     Pupils: Pupils are equal, round, and reactive to light.  Cardiovascular:     Rate and Rhythm: Normal rate.  Pulmonary:     Effort: Pulmonary effort is normal. No respiratory distress.  Abdominal:     General: There is no distension.     Palpations: Abdomen is soft.  Musculoskeletal:        General: Normal range of motion.     Cervical back: Normal range of motion.  Skin:    General: Skin is warm and dry.       Neurological:     Mental Status: He is alert.     UC Treatments / Results  Labs (all labs ordered are listed, but only abnormal results are displayed) Labs Reviewed - No data to display  EKG   Radiology No results  found.  Procedures Procedures (including critical care time)  Medications Ordered in UC Medications - No data to display  Initial Impression / Assessment and Plan / UC Course  I have reviewed the triage vital signs and the nursing notes.  Pertinent labs & imaging results that were available during my care of the patient were reviewed by me and considered in my medical decision making (see chart for details).     Normally would prescribe Augmentin.  There is a 03/05/21 of this at this time.  Will prescribe instead Cipro and Flagyl.  Return as needed Final Clinical Impressions(s) / UC Diagnoses   Final diagnoses:  Pilonidal abscess of natal cleft     Discharge Instructions      You need to take 2 antibiotics.  Cipro 500 mg, and metronidazole 500 mg each twice a day.  Take 2 doses today Do not take tizanidine (muscle relaxer) while on Cipro Warm compresses to the area may help.  Warmth for 20 minutes every couple hours over the weekend Call or return for problems.   ED Prescriptions     Medication Sig Dispense Auth. Provider   ciprofloxacin (CIPRO) 500 MG tablet  Take 1 tablet (500 mg total) by mouth 2 (two) times daily. 14 tablet Eustace Moore, MD   metroNIDAZOLE (FLAGYL) 500 MG tablet Take 1 tablet (500 mg total) by mouth 2 (two) times daily. 14 tablet Eustace Moore, MD      PDMP not reviewed this encounter.   Eustace Moore, MD 01/03/21 1055

## 2021-01-05 ENCOUNTER — Other Ambulatory Visit (HOSPITAL_COMMUNITY): Payer: 59

## 2021-01-05 ENCOUNTER — Inpatient Hospital Stay (HOSPITAL_COMMUNITY)
Admission: EM | Admit: 2021-01-05 | Discharge: 2021-01-08 | DRG: 682 | Disposition: A | Discharge status: Home / Self Care | Payer: 59 | Attending: Internal Medicine | Admitting: Internal Medicine

## 2021-01-05 ENCOUNTER — Emergency Department (HOSPITAL_COMMUNITY): Payer: 59

## 2021-01-05 ENCOUNTER — Other Ambulatory Visit: Payer: Self-pay

## 2021-01-05 DIAGNOSIS — F419 Anxiety disorder, unspecified: Secondary | ICD-10-CM | POA: Diagnosis present

## 2021-01-05 DIAGNOSIS — G4733 Obstructive sleep apnea (adult) (pediatric): Secondary | ICD-10-CM | POA: Diagnosis present

## 2021-01-05 DIAGNOSIS — D649 Anemia, unspecified: Secondary | ICD-10-CM | POA: Diagnosis not present

## 2021-01-05 DIAGNOSIS — I959 Hypotension, unspecified: Secondary | ICD-10-CM | POA: Diagnosis not present

## 2021-01-05 DIAGNOSIS — R55 Syncope and collapse: Secondary | ICD-10-CM | POA: Diagnosis present

## 2021-01-05 DIAGNOSIS — M4316 Spondylolisthesis, lumbar region: Secondary | ICD-10-CM | POA: Diagnosis present

## 2021-01-05 DIAGNOSIS — N179 Acute kidney failure, unspecified: Secondary | ICD-10-CM

## 2021-01-05 DIAGNOSIS — G8929 Other chronic pain: Secondary | ICD-10-CM | POA: Diagnosis present

## 2021-01-05 DIAGNOSIS — Z20822 Contact with and (suspected) exposure to covid-19: Secondary | ICD-10-CM | POA: Diagnosis present

## 2021-01-05 DIAGNOSIS — R571 Hypovolemic shock: Secondary | ICD-10-CM | POA: Diagnosis present

## 2021-01-05 DIAGNOSIS — Z6841 Body Mass Index (BMI) 40.0 and over, adult: Secondary | ICD-10-CM

## 2021-01-05 DIAGNOSIS — Z8249 Family history of ischemic heart disease and other diseases of the circulatory system: Secondary | ICD-10-CM | POA: Diagnosis not present

## 2021-01-05 DIAGNOSIS — I10 Essential (primary) hypertension: Secondary | ICD-10-CM | POA: Diagnosis present

## 2021-01-05 DIAGNOSIS — F32A Depression, unspecified: Secondary | ICD-10-CM | POA: Diagnosis present

## 2021-01-05 DIAGNOSIS — L0501 Pilonidal cyst with abscess: Secondary | ICD-10-CM | POA: Diagnosis present

## 2021-01-05 DIAGNOSIS — Z87442 Personal history of urinary calculi: Secondary | ICD-10-CM

## 2021-01-05 DIAGNOSIS — N170 Acute kidney failure with tubular necrosis: Principal | ICD-10-CM | POA: Diagnosis present

## 2021-01-05 DIAGNOSIS — Z79899 Other long term (current) drug therapy: Secondary | ICD-10-CM

## 2021-01-05 DIAGNOSIS — E785 Hyperlipidemia, unspecified: Secondary | ICD-10-CM | POA: Diagnosis present

## 2021-01-05 DIAGNOSIS — D619 Aplastic anemia, unspecified: Secondary | ICD-10-CM | POA: Diagnosis not present

## 2021-01-05 DIAGNOSIS — Z9989 Dependence on other enabling machines and devices: Secondary | ICD-10-CM

## 2021-01-05 DIAGNOSIS — I950 Idiopathic hypotension: Secondary | ICD-10-CM | POA: Diagnosis not present

## 2021-01-05 DIAGNOSIS — I428 Other cardiomyopathies: Secondary | ICD-10-CM | POA: Diagnosis not present

## 2021-01-05 DIAGNOSIS — N200 Calculus of kidney: Secondary | ICD-10-CM | POA: Diagnosis not present

## 2021-01-05 LAB — SODIUM, URINE, RANDOM: Sodium, Ur: 94 mmol/L

## 2021-01-05 LAB — HEPATIC FUNCTION PANEL
ALT: 41 U/L (ref 0–44)
AST: 25 U/L (ref 15–41)
Albumin: 3.2 g/dL — ABNORMAL LOW (ref 3.5–5.0)
Alkaline Phosphatase: 12 U/L — ABNORMAL LOW (ref 38–126)
Bilirubin, Direct: <0.1 mg/dL (ref 0.0–0.2)
Total Bilirubin: 0.6 mg/dL (ref 0.3–1.2)
Total Protein: 6.2 g/dL — ABNORMAL LOW (ref 6.5–8.1)

## 2021-01-05 LAB — IRON AND TIBC
Iron: 62 ug/dL (ref 45–182)
Saturation Ratios: 13 % — ABNORMAL LOW (ref 17.9–39.5)
TIBC: 465 ug/dL — ABNORMAL HIGH (ref 250–450)
UIBC: 403 ug/dL

## 2021-01-05 LAB — CBC WITH DIFFERENTIAL/PLATELET
Abs Immature Granulocytes: 0.02 10*3/uL (ref 0.00–0.07)
Basophils Absolute: 0 10*3/uL (ref 0.0–0.1)
Basophils Relative: 1 %
Eosinophils Absolute: 0.2 10*3/uL (ref 0.0–0.5)
Eosinophils Relative: 3 %
HCT: 24.7 % — ABNORMAL LOW (ref 39.0–52.0)
Hemoglobin: 8 g/dL — ABNORMAL LOW (ref 13.0–17.0)
Immature Granulocytes: 0 %
Lymphocytes Relative: 22 %
Lymphs Abs: 1.4 10*3/uL (ref 0.7–4.0)
MCH: 30.5 pg (ref 26.0–34.0)
MCHC: 32.4 g/dL (ref 30.0–36.0)
MCV: 94.3 fL (ref 80.0–100.0)
Monocytes Absolute: 0.8 10*3/uL (ref 0.1–1.0)
Monocytes Relative: 12 %
Neutro Abs: 4.1 10*3/uL (ref 1.7–7.7)
Neutrophils Relative %: 62 %
Platelets: 238 10*3/uL (ref 150–400)
RBC: 2.62 MIL/uL — ABNORMAL LOW (ref 4.22–5.81)
RDW: 12.9 % (ref 11.5–15.5)
WBC: 6.5 10*3/uL (ref 4.0–10.5)
nRBC: 0 % (ref 0.0–0.2)

## 2021-01-05 LAB — HEMOGLOBIN AND HEMATOCRIT, BLOOD
HCT: 24.1 % — ABNORMAL LOW (ref 39.0–52.0)
Hemoglobin: 7.6 g/dL — ABNORMAL LOW (ref 13.0–17.0)

## 2021-01-05 LAB — BASIC METABOLIC PANEL
Anion gap: 12 (ref 5–15)
BUN: 103 mg/dL — ABNORMAL HIGH (ref 6–20)
CO2: 20 mmol/L — ABNORMAL LOW (ref 22–32)
Calcium: 8.7 mg/dL — ABNORMAL LOW (ref 8.9–10.3)
Chloride: 106 mmol/L (ref 98–111)
Creatinine, Ser: 5.26 mg/dL — ABNORMAL HIGH (ref 0.61–1.24)
GFR, Estimated: 14 mL/min — ABNORMAL LOW (ref >60)
Glucose, Bld: 91 mg/dL (ref 70–99)
Potassium: 5 mmol/L (ref 3.5–5.1)
Sodium: 138 mmol/L (ref 135–145)

## 2021-01-05 LAB — TROPONIN I (HIGH SENSITIVITY): Troponin I (High Sensitivity): 5 ng/L (ref <18)

## 2021-01-05 LAB — LACTIC ACID, PLASMA: Lactic Acid, Venous: 1 mmol/L (ref 0.5–1.9)

## 2021-01-05 LAB — RESP PANEL BY RT-PCR (FLU A&B, COVID) ARPGX2
Influenza A by PCR: NEGATIVE
Influenza B by PCR: NEGATIVE
SARS Coronavirus 2 by RT PCR: NEGATIVE

## 2021-01-05 LAB — TSH: TSH: 1.164 u[IU]/mL (ref 0.350–4.500)

## 2021-01-05 LAB — C-REACTIVE PROTEIN: CRP: 1.4 mg/dL — ABNORMAL HIGH (ref <1.0)

## 2021-01-05 LAB — FOLATE: Folate: 7.5 ng/mL (ref >5.9)

## 2021-01-05 LAB — URINALYSIS, ROUTINE W REFLEX MICROSCOPIC
Bilirubin Urine: NEGATIVE
Glucose, UA: NEGATIVE mg/dL
Hgb urine dipstick: NEGATIVE
Ketones, ur: NEGATIVE mg/dL
Leukocytes,Ua: NEGATIVE
Nitrite: NEGATIVE
Protein, ur: NEGATIVE mg/dL
Specific Gravity, Urine: 1.011 (ref 1.005–1.030)
pH: 5 (ref 5.0–8.0)

## 2021-01-05 LAB — ABO/RH: ABO/RH(D): UNDETERMINED

## 2021-01-05 LAB — SEDIMENTATION RATE: Sed Rate: 20 mm/hr — ABNORMAL HIGH (ref 0–16)

## 2021-01-05 LAB — LACTATE DEHYDROGENASE: LDH: 135 U/L (ref 98–192)

## 2021-01-05 LAB — SAVE SMEAR(SSMR), FOR PROVIDER SLIDE REVIEW

## 2021-01-05 LAB — RETICULOCYTES
Immature Retic Fract: 11.1 % (ref 2.3–15.9)
RBC.: 2.49 MIL/uL — ABNORMAL LOW (ref 4.22–5.81)
Retic Count, Absolute: 83.2 10*3/uL (ref 19.0–186.0)
Retic Ct Pct: 3.3 % — ABNORMAL HIGH (ref 0.4–3.1)

## 2021-01-05 LAB — CREATININE, URINE, RANDOM: Creatinine, Urine: 103.37 mg/dL

## 2021-01-05 LAB — VITAMIN B12: Vitamin B-12: 543 pg/mL (ref 180–914)

## 2021-01-05 LAB — POC OCCULT BLOOD, ED: Fecal Occult Bld: NEGATIVE

## 2021-01-05 LAB — PREPARE RBC (CROSSMATCH)

## 2021-01-05 LAB — FERRITIN: Ferritin: 497 ng/mL — ABNORMAL HIGH (ref 24–336)

## 2021-01-05 LAB — CBG MONITORING, ED: Glucose-Capillary: 106 mg/dL — ABNORMAL HIGH (ref 70–99)

## 2021-01-05 MED ORDER — LACTATED RINGERS IV SOLN
INTRAVENOUS | Status: AC
  Administered 2021-01-06: 900 mL via INTRAVENOUS

## 2021-01-05 MED ORDER — GABAPENTIN 300 MG PO CAPS
300.0000 mg | ORAL_CAPSULE | Freq: Three times a day (TID) | ORAL | Status: DC
  Administered 2021-01-05 – 2021-01-08 (×9): 300 mg via ORAL
  Filled 2021-01-05 (×9): qty 1

## 2021-01-05 MED ORDER — LACTATED RINGERS IV BOLUS
1000.0000 mL | Freq: Once | INTRAVENOUS | Status: AC
  Administered 2021-01-05: 1000 mL via INTRAVENOUS

## 2021-01-05 MED ORDER — SODIUM CHLORIDE 0.9% FLUSH
3.0000 mL | Freq: Two times a day (BID) | INTRAVENOUS | Status: DC
  Administered 2021-01-05 – 2021-01-08 (×4): 3 mL via INTRAVENOUS

## 2021-01-05 MED ORDER — HEPARIN SODIUM (PORCINE) 5000 UNIT/ML IJ SOLN: 5000.0000 [IU] | Freq: Three times a day (TID) | INTRAMUSCULAR | Status: DC

## 2021-01-05 MED ORDER — TIZANIDINE HCL 4 MG PO TABS
4.0000 mg | ORAL_TABLET | Freq: Three times a day (TID) | ORAL | Status: DC | PRN
  Administered 2021-01-05 – 2021-01-08 (×6): 4 mg via ORAL
  Filled 2021-01-05 (×10): qty 1

## 2021-01-05 MED ORDER — TRAMADOL HCL 50 MG PO TABS
50.0000 mg | ORAL_TABLET | Freq: Three times a day (TID) | ORAL | Status: DC | PRN
Start: 2021-01-05 — End: 2021-01-08
  Administered 2021-01-05 – 2021-01-08 (×7): 50 mg via ORAL
  Filled 2021-01-05 (×7): qty 1

## 2021-01-05 MED ORDER — SODIUM CHLORIDE 0.9% IV SOLUTION: Freq: Once | INTRAVENOUS | Status: AC

## 2021-01-05 MED ORDER — BUPROPION HCL ER (XL) 300 MG PO TB24
300.0000 mg | ORAL_TABLET | Freq: Every morning | ORAL | Status: DC
  Administered 2021-01-06 – 2021-01-08 (×3): 300 mg via ORAL
  Filled 2021-01-05 (×3): qty 1

## 2021-01-05 NOTE — Consult Note (Addendum)
[facility] Nephrology Consultation Note  Requesting MD: Dr Reymundo Poll Reason for consult: AKI  HPI:  Hunter [name] is a 35 y.o. 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  He is on lisinopril and hydrochlorothiazide for the management of hypertension which was diagnosed at the age of 78.  He reported feeling tachycardia for last few days.  This morning when he was with hospitalist labs in physician cafeteria, he felt dizzy, lightheaded and then had a syncopal episode.  In the ER his blood pressure was 67/37.  The labs showed BUN 103, creatinine level 5.26, potassium 5, CO2 20, hemoglobin 8.0.  He received 2 L of LR bolus and started maintenance IV fluid.  The urinalysis was bland.  The kidney ultrasound without hydronephrosis however there was nonobstructing stone at the lower pole of the left kidney.  The baseline creatinine level is normal.  Stool fecal occult blood test was negative.  There is a plan to transfuse a unit of blood transfusion. Of note, patient presented to ER on 9/3 and was diagnosed with  pilonidal abscess and was started on cipro and flagyl. He started taking abx since yesterday and took only 3 doses.  Currently patient reports feeling good and back to his baseline.  Denied headache, dizziness, nausea, vomiting, chest pain, shortness of breath.  No urinary complaint.  Denies use of NSAIDs.  He is blood pressure is gradually improving with IV fluid.  He is on room air.  Urine output already 1.2 L. Per patient, he had AKI in 2018 with peak creatinine level around 5 in the setting of GI infection and diarrhea.  It was improved to baseline with IV hydration. Denies family history of kidney disease.  Creatinine, Ser  Date/Time Value Ref Range Status  01/05/2021 08:51  AM 5.26 (H) 0.61 - 1.24 mg/dL Final     PMHx:   Past Medical History:  Diagnosis Date   Back disorder    L3 spondelothesis   Hypertension    Kidney stones     Past Surgical History:  Procedure Laterality Date   MOLE REMOVAL     ORIF RADIUS & ULNA FRACTURES     rods put in   RHINOPLASTY      Family Hx:  Family History  Problem Relation Age of Onset   Hypertension Mother    Cancer Mother        cancer    Social History:  reports that he has never smoked. He has never used smokeless tobacco. He reports current alcohol use. He reports that he does not use drugs.  Allergies: No Known Allergies  Medications: Prior to Admission medications   Medication Sig Start Date End Date Taking? Authorizing Provider  buPROPion (WELLBUTRIN XL) 300 MG 24 hr tablet TAKE 1 TABLET BY MOUTH EVERY MORNING 05/22/20 05/22/21 Yes   ciprofloxacin (CIPRO) 500 MG tablet Take 1 tablet (500 mg total) by mouth 2 (two) times daily. 01/03/21  Yes Eustace Moore, MD  clotrimazole (LOTRIMIN) 1 % cream Apply topically 2 (two) times daily. 09/20/20  Yes [provider]  fenofibrate 160 MG tablet TAKE 1 TABLET BY MOUTH ONCE DAILY **NEED OFFICE VISIT FOR FURTHER REFILLS** 07/03/20 07/03/21 Yes Ardith Dark, MD  gabapentin (NEURONTIN) 300 MG capsule TAKE 1 CAPSULE BY MOUTH THREE TIMES DAILY Patient taking differently: Take 300-600  mg by mouth See admin instructions. Take one tablet by mouth in the morning, then take 2 tablets by mouth at night per patient 03/11/20 03/11/21 Yes Arman Filter, PA-C  hydrochlorothiazide (HYDRODIURIL) 25 MG tablet Take one tablet (25 mg dose) by mouth in the morning. 08/05/20  Yes   HYDROcodone-acetaminophen (NORCO/VICODIN) 5-325 MG tablet Take one tablet by mouth at bedtime as needed for severe pain only for up to 30 days. Patient taking differently: Take 1 tablet by mouth every 6 (six) hours as needed (Back pain). 12/03/20  Yes   lidocaine (LIDODERM) 5 % Wear patch for up to 12  hours then remove and discard patch or as directed by MD Patient taking differently: Place 1 patch onto the skin daily as needed (For back pain). 12/03/20  Yes   lisinopril (PRINIVIL,ZESTRIL) 40 MG tablet Take 40 mg by mouth daily.     Yes [provider]  methylphenidate (CONCERTA) 36 MG PO CR tablet Take 1 tablet by mouth every morning 10/27/20  Yes   methylphenidate (RITALIN) 5 MG tablet Take 1 tablet by mouth every evening as needed for extended focus 12/27/20  Yes   metroNIDAZOLE (FLAGYL) 500 MG tablet Take 1 tablet (500 mg total) by mouth 2 (two) times daily. 01/03/21  Yes Eustace Moore, MD  omega-3 acid ethyl esters (LOVAZA) 1 g capsule Take 1 g by mouth at bedtime.   Yes [provider]  tiZANidine (ZANAFLEX) 4 MG tablet TAKE 1 TABLET BY MOUTH EVERY 8 HOURS AS NEEDED Patient taking differently: Take 2 mg by mouth in the morning and at bedtime. 06/06/20 06/06/21 Yes Reynolds, Sarah B, PA-C  traMADol (ULTRAM) 50 MG tablet Take 1 to 2 tablets by mouth 3 times daily as needed for severe pain Patient taking differently: Take 100 mg by mouth 2 (two) times daily. 09/03/20  Yes   buPROPion (WELLBUTRIN XL) 300 MG 24 hr tablet TAKE 1 TABLET BY MOUTH EVERY MORNING Patient not taking: No sig reported 05/02/20 05/02/21    buPROPion (WELLBUTRIN XL) 300 MG 24 hr tablet Take one tablet by mouth once daily in the morning. Patient not taking: No sig reported 08/12/20     buPROPion (WELLBUTRIN XL) 300 MG 24 hr tablet Take 1 tablet by mouth every morning Patient not taking: No sig reported 10/27/20     fenofibrate 160 MG tablet Take one tablet (160 mg dose) by mouth daily. Patient not taking: No sig reported 08/12/20     gabapentin (NEURONTIN) 300 MG capsule Take 1 capsule (300 mg dose) by mouth 3 (three) times a day. Patient not taking: No sig reported 09/03/20     gabapentin (NEURONTIN) 300 MG capsule Take one capsule (300 mg dose) by mouth 3 (three) times a day. Patient not taking: No sig reported  12/03/20     lisinopril (ZESTRIL) 40 MG tablet TAKE 1 TABLET BY MOUTH ONCE DAILY Patient not taking: No sig reported 01/15/20 01/14/21  Ardith Dark, MD  lisinopril (ZESTRIL) 40 MG tablet Take one tablet (40 mg dose) by mouth daily. Patient not taking: No sig reported 12/12/20     methylphenidate (CONCERTA) 36 MG PO CR tablet Take 1 tablet by mouth every morning Patient not taking: No sig reported 11/27/20     methylphenidate (CONCERTA) 36 MG PO CR tablet Take 1 tablet by mouth every morning Patient not taking: No sig reported 12/27/20     methylphenidate (RITALIN) 5 MG tablet TAKE 1 TABLET BY MOUTH EVERY EVENING AS NEEDED FOR  EXTENDED FOCUS 05/23/20 11/19/20  Cherly Hensen, MD  methylphenidate (RITALIN) 5 MG tablet TAKE 1 TABLET BY MOUTH EVERY EVENING AS NEEDED FOR EXTENDED FOCUS **DO NOT FILL SOONER THAN 60 DAYS FROM 05/23/20** 05/23/20 11/19/20  Cherly Hensen, MD  methylphenidate (RITALIN) 5 MG tablet TAKE 1 TABLET BY MOUTH EVERY EVENING AS NEEDED FOR EXTENDED FOCUS 05/23/20 11/19/20  Cherly Hensen, MD  methylphenidate 36 MG PO CR tablet TAKE 1 TABLET BY MOUTH EVERY MORNING 05/23/20 11/19/20  Cherly Hensen, MD  methylphenidate 36 MG PO CR tablet TAKE 1 TABLET BY MOUTH EVERY MORNING 05/23/20 11/19/20  Cherly Hensen, MD  methylphenidate 36 MG PO CR tablet TAKE 1 TABLET BY MOUTH EVERY MORNING **FILL 60 DAYS AFTER 05/23/20** 05/23/20 11/19/20  Cherly Hensen, MD  methylphenidate 36 MG PO CR tablet TAKE 1 TABLET BY MOUTH EVERY MORNING 02/29/20 08/27/20  Cherly Hensen, MD  methylphenidate 36 MG PO CR tablet TAKE 1 TABLET BY MOUTH EVERY MORNING 02/29/20 08/27/20  Cherly Hensen, MD  methylphenidate 36 MG PO CR tablet TAKE 1 TABLET BY MOUTH EVERY MORNING 02/29/20 08/27/20  Cherly Hensen, MD  methylphenidate 36 MG PO CR tablet Take one tablet by mouth daily. Patient not taking: No sig reported 08/12/20     methylphenidate 36 MG PO CR tablet Take one tablet by  mouth daily. Patient not taking: No sig reported 08/12/20     methylphenidate 36 MG PO CR tablet Take one tablet by mouth every morning. Patient not taking: No sig reported 08/12/20     tiZANidine (ZANAFLEX) 4 MG tablet TAKE 1 TABLET BY MOUTH EVERY 8 HOURS AS NEEDED Patient not taking: No sig reported 03/11/20 03/11/21  Thad Ranger, Gaye Alken, PA-C  tiZANidine (ZANAFLEX) 4 MG tablet Take one tablet (4 mg dose) by mouth every 8 (eight) hours as needed. Patient not taking: No sig reported 09/03/20     tiZANidine (ZANAFLEX) 4 MG tablet Take one tablet (4 mg dose) by mouth every 8 (eight) hours as needed. Patient not taking: No sig reported 12/03/20     traMADol (ULTRAM) 50 MG tablet Take 1 to 2 tablets by mouth (50-100 mg dose) by mouth up to 3 (three) times a day as needed for severe pain. Patient not taking: No sig reported 12/03/20       I have reviewed the patient's current medications.  Labs:  Results for orders placed or performed during the hospital encounter of 01/05/21 (from the past 48 hour(s))  CBG monitoring, ED     Status: Abnormal   Collection Time: 01/05/21  8:25 AM  Result Value Ref Range   Glucose-Capillary 106 (H) 70 - 99 mg/dL    Comment: Glucose reference range applies only to samples taken after fasting for at least 8 hours.  CBC with Differential     Status: Abnormal   Collection Time: 01/05/21  8:51 AM  Result Value Ref Range   WBC 6.5 4.0 - 10.5 K/uL   RBC 2.62 (L) 4.22 - 5.81 MIL/uL   Hemoglobin 8.0 (L) 13.0 - 17.0 g/dL   HCT 93.9 (L) 03.0 - 09.2 %   MCV 94.3 80.0 - 100.0 fL   MCH 30.5 26.0 - 34.0 pg   MCHC 32.4 30.0 - 36.0 g/dL   RDW 33.0 07.6 - 22.6 %   Platelets 238 150 - 400 K/uL   nRBC 0.0 0.0 - 0.2 %   Neutrophils Relative % 62 %   Neutro Abs 4.1 1.7 -  7.7 K/uL   Lymphocytes Relative 22 %   Lymphs Abs 1.4 0.7 - 4.0 K/uL   Monocytes Relative 12 %   Monocytes Absolute 0.8 0.1 - 1.0 K/uL   Eosinophils Relative 3 %   Eosinophils Absolute 0.2 0.0 - 0.5 K/uL    Basophils Relative 1 %   Basophils Absolute 0.0 0.0 - 0.1 K/uL   Immature Granulocytes 0 %   Abs Immature Granulocytes 0.02 0.00 - 0.07 K/uL    Comment: Performed at [facility], [address] [place], [place] [address]  Basic metabolic panel     Status: Abnormal   Collection Time: 01/05/21  8:51 AM  Result Value Ref Range   Sodium 138 135 - 145 mmol/L   Potassium 5.0 3.5 - 5.1 mmol/L   Chloride 106 98 - 111 mmol/L   CO2 20 (L) 22 - 32 mmol/L   Glucose, Bld 91 70 - 99 mg/dL    Comment: Glucose reference range applies only to samples taken after fasting for at least 8 hours.   BUN 103 (H) 6 - 20 mg/dL   Creatinine, Ser 7.82 (H) 0.61 - 1.24 mg/dL   Calcium 8.7 (L) 8.9 - 10.3 mg/dL   GFR, Estimated 14 (L) >60 mL/min    Comment: (NOTE) Calculated using the CKD-EPI Creatinine Equation (2021)    Anion gap 12 5 - 15    Comment: Performed at [facility], [address], [place], [place] [address]  Lactic acid, plasma     Status: None   Collection Time: 01/05/21  8:51 AM  Result Value Ref Range   Lactic Acid, Venous 1.0 0.5 - 1.9 mmol/L    Comment: Performed at [facility], [address] [place], [place] [address]  ABO/Rh     Status: None   Collection Time: 01/05/21  8:51 AM  Result Value Ref Range   ABO/RH(D)      INCONCLUSIVE Performed at [facility], [address] [place], [place] [address]   POC occult blood, ED     Status: None   Collection Time: 01/05/21  9:56 AM  Result Value Ref Range   Fecal Occult Bld NEGATIVE NEGATIVE  Type and screen [facility]     Status: None   Collection Time: 01/05/21 10:45 AM  Result Value Ref Range   ABO/RH(D) ICLR^INCONCLUSIVE    Antibody Screen NEG    Sample Expiration      01/08/2021,2359 Performed at [facility], [address], [place], [place] [address]   Hemoglobin and hematocrit, blood     Status: Abnormal   Collection Time: 01/05/21 10:45 AM  Result Value Ref Range    Hemoglobin 7.6 (L) 13.0 - 17.0 g/dL   HCT 95.0 (L) 93.2 - 67.1 %    Comment: Performed at [facility], [address], [place], [place] [address]  Vitamin B12     Status: None   Collection Time: 01/05/21 10:45 AM  Result Value Ref Range   Vitamin B-12 543 180 - 914 pg/mL    Comment: (NOTE) This assay is not validated for testing neonatal or myeloproliferative syndrome specimens for Vitamin B12 levels. Performed at [facility], [address] [place], [place] [address]   Folate     Status: None   Collection Time: 01/05/21 10:45 AM  Result Value Ref Range   Folate 7.5 >5.9 ng/mL    Comment: Performed at [facility],  [address] [place], [place] [address]  Iron and TIBC     Status: Abnormal   Collection Time: 01/05/21 10:45 AM  Result Value Ref Range   Iron 62 45 - 182 ug/dL   TIBC 673 (H) 419 - 379 ug/dL   Saturation Ratios 13 (L) 17.9 - 39.5 %   UIBC 403 ug/dL    Comment: Performed at [facility], [address], [place], [place] [address]  Ferritin     Status: Abnormal   Collection Time: 01/05/21 10:45 AM  Result Value Ref Range   Ferritin 497 (H) 24 - 336 ng/mL    Comment: Performed at [facility], [address] [place], [place] [address]  Reticulocytes     Status: Abnormal   Collection Time: 01/05/21 10:45 AM  Result Value Ref Range   Retic Ct Pct 3.3 (H) 0.4 - 3.1 %   RBC. 2.49 (L) 4.22 - 5.81 MIL/uL   Retic Count, Absolute 83.2 19.0 - 186.0 K/uL   Immature Retic Fract 11.1 2.3 - 15.9 %    Comment: Performed at [facility], [address], [place], [place] [address]  Lactate dehydrogenase     Status: None   Collection Time: 01/05/21 10:45 AM  Result Value Ref Range   LDH 135 98 - 192 U/L    Comment: Performed at [facility], [address], [place], [place] [address]  Hepatic function panel     Status: Abnormal   Collection Time: 01/05/21 10:45 AM  Result Value Ref Range   Total Protein 6.2 (L) 6.5 - 8.1 g/dL    Albumin 3.2 (L) 3.5 - 5.0 g/dL   AST 25 15 - 41 U/L   ALT 41 0 - 44 U/L   Alkaline Phosphatase 12 (L) 38 - 126 U/L   Total Bilirubin 0.6 0.3 - 1.2 mg/dL   Bilirubin, Direct <9.6 0.0 - 0.2 mg/dL   Indirect Bilirubin NOT CALCULATED 0.3 - 0.9 mg/dL    Comment: Performed at [facility], [address], [place], [place] [address]  Resp Panel by RT-PCR (Flu A&B, Covid) Nasopharyngeal Swab     Status: None   Collection Time: 01/05/21 12:18 PM   Specimen: Nasopharyngeal Swab; Nasopharyngeal(NP) swabs in vial transport medium  Result Value Ref Range   SARS Coronavirus 2 by RT PCR NEGATIVE NEGATIVE    Comment: (NOTE) SARS-CoV-2 target nucleic acids are NOT DETECTED.  The SARS-CoV-2 RNA is generally detectable in upper respiratory specimens during the acute phase of infection. The lowest concentration of SARS-CoV-2 viral copies this assay can detect is 138 copies/mL. A negative result does not preclude SARS-Cov-2 infection and should not be used as the sole basis for treatment or other patient management decisions. A negative result may occur with  improper specimen collection/handling, submission of specimen other than nasopharyngeal swab, presence of viral mutation(s) within the areas targeted by this assay, and inadequate number of viral copies(<138 copies/mL). A negative result must be combined with clinical observations, patient history, and epidemiological information. The expected result is Negative.  Fact Sheet for Patients:  BloggerCourse.com  Fact Sheet for Healthcare Providers:  SeriousBroker.it  This test is no t yet approved or cleared by the [place] FDA and  has been authorized for detection and/or diagnosis of SARS-CoV-2 by FDA under an Emergency Use Authorization (EUA). This EUA will remain  in effect (meaning this test can be used) for the duration of the COVID-19 declaration under  Section 564(b)(1) of the Act,  21 U.S.C.section 360bbb-3(b)(1), unless the authorization is terminated  or revoked sooner.       Influenza A by PCR NEGATIVE NEGATIVE   Influenza B by PCR NEGATIVE NEGATIVE    Comment: (NOTE) The Xpert Xpress SARS-CoV-2/FLU/RSV plus assay is intended as an aid in the diagnosis of influenza from Nasopharyngeal swab specimens and should not be used as a sole basis for treatment. Nasal washings and aspirates are unacceptable for Xpert Xpress SARS-CoV-2/FLU/RSV testing.  Fact Sheet for Patients: BloggerCourse.comhttps://www.fda.gov/media/152166/download  Fact Sheet for Healthcare Providers: SeriousBroker.ithttps://www.fda.gov/media/152162/download  This test is not yet approved or cleared by the [place] FDA and has been authorized for detection and/or diagnosis of SARS-CoV-2 by FDA under an Emergency Use Authorization (EUA). This EUA will remain in effect (meaning this test can be used) for the duration of the COVID-19 declaration under Section 564(b)(1) of the Act, 21 U.S.C. section 360bbb-3(b)(1), unless the authorization is terminated or revoked.  Performed at [facility], [address] [place], [place] [address]   Troponin I (High Sensitivity)     Status: None   Collection Time: 01/05/21 12:21 PM  Result Value Ref Range   Troponin I (High Sensitivity) 5 <18 ng/L    Comment: (NOTE) Elevated high sensitivity troponin I (hsTnI) values and significant  changes across serial measurements may suggest ACS but many other  chronic and acute conditions are known to elevate hsTnI results.  Refer to the "Links" section for chest pain algorithms and additional  guidance. Performed at [facility], [address] [place], [place] [address]   Urinalysis, Routine w reflex microscopic     Status: None   Collection Time: 01/05/21 12:46 PM  Result Value Ref Range   Color, Urine YELLOW YELLOW   APPearance CLEAR CLEAR   Specific Gravity, Urine 1.011 1.005 - 1.030   pH 5.0 5.0 - 8.0   Glucose, UA  NEGATIVE NEGATIVE mg/dL   Hgb urine dipstick NEGATIVE NEGATIVE   Bilirubin Urine NEGATIVE NEGATIVE   Ketones, ur NEGATIVE NEGATIVE mg/dL   Protein, ur NEGATIVE NEGATIVE mg/dL   Nitrite NEGATIVE NEGATIVE   Leukocytes,Ua NEGATIVE NEGATIVE    Comment: Performed at [facility], [address] [place], [place] [address]  Sodium, urine, random     Status: None   Collection Time: 01/05/21 12:46 PM  Result Value Ref Range   Sodium, Ur 94 mmol/L    Comment: Performed at [facility], [address] [place], [place] [address]  Creatinine, urine, random     Status: None   Collection Time: 01/05/21 12:46 PM  Result Value Ref Range   Creatinine, Urine 103.37 mg/dL    Comment: Performed at [facility], [address] [place], [place] [address]  C-reactive protein     Status: Abnormal   Collection Time: 01/05/21 12:49 PM  Result Value Ref Range   CRP 1.4 (H) <1.0 mg/dL    Comment: Performed at [facility], [address] [place], [place] [address]  Save Smear     Status: None   Collection Time: 01/05/21 12:49 PM  Result Value Ref Range   Smear Review SMEAR STAINED AND AVAILABLE FOR REVIEW     Comment: Performed at [facility], [address] [place], [place] [address]  Prepare RBC (crossmatch)     Status: None   Collection Time: 01/05/21  2:31 PM  Result Value Ref Range   Order  Confirmation      ORDER PROCESSED BY BLOOD BANK Performed at [facility], [address], [place], [place] [address]      ROS:  Pertinent items noted in HPI and remainder of comprehensive ROS otherwise negative.  Physical Exam: Vitals:   01/05/21 1345 01/05/21 1435  BP: (!) 91/34 (!) 108/56  Pulse: 87 86  Resp: 15 16  Temp:  98.7 F (37.1 C)  SpO2: 100% 99%     General exam: Appears calm and comfortable  Respiratory system: Clear to auscultation. Respiratory effort normal. No wheezing or crackle Cardiovascular system: S1 & S2 heard, RRR.  No pedal  edema. Gastrointestinal system: Abdomen is nondistended, soft and nontender. Normal bowel sounds heard. Central nervous system: Alert and oriented. No focal neurological deficits. Extremities: Symmetric 5 x 5 power. Skin: No rashes, lesions or ulcers Psychiatry: Judgement and insight appear normal. Mood & affect appropriate.   Assessment/Plan:  #Acute kidney injury, nonoliguric likely hemodynamically mediated in the setting of hypotension causing reduced renal perfusion.  On lisinopril and hydrochlorothiazide which is on hold now.  The blood pressure gradually improving with fluid resuscitation.  UA and kidney ultrasound unremarkable.  Agree with continuing IV fluid.  Strict ins and outs, daily lab.  Expect renal recovery.  Many serologies sent by primary team.  No need for dialysis.  #Syncope/symptomatic hypotension in the setting of lisinopril/HCTZ use.  Patient reports palpitation last few days and had syncopal episode this morning.  Holding antihypertensive and monitor BP closely.  #Anemia: FOBT negative.  Plan to transfuse a unit of PRBC.  No sign of TMA.  Further evaluation per primary team.  #Chronic back pain: Avoid NSAIDs.  Anneka Studer Jaynie Collins 01/05/2021, 3:19 PM  [facility].

## 2021-01-05 NOTE — ED Provider Notes (Signed)
[facility] EMERGENCY DEPARTMENT Provider Note   CSN: [id number] Arrival date & time: 01/05/21  2376     History Chief Complaint  Patient presents with   Dizziness   Loss of Consciousness   Near Syncope    Hunter [name] is a 35 y.o. male.   Dizziness Associated symptoms: syncope   Associated symptoms: no chest pain and no shortness of breath   Loss of Consciousness Associated symptoms: dizziness   Associated symptoms: no chest pain, no confusion and no shortness of breath   Near Syncope Pertinent negatives include no chest pain, no abdominal pain and no shortness of breath.  Patient presents with a syncopal episode.  States he was up on the floor and felt lightheaded.  States he feels his blood pressure was low.  Did not feel like vertigo.  States he stood up and then passed out.  Pains on the toe on the left.  States he may have landed on.  No other injury.  No chest pain.  Trouble breathing.  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  States he does not feel as if it is getting any worse.  No nausea or vomiting.  States he has been eating well.  Patient is a psychiatric resident.    Past Medical History:  Diagnosis Date   Back disorder    L3 spondelothesis   Hypertension    Kidney stones     Patient Active Problem List   Diagnosis Date Noted   Acute renal failure (ARF) (HCC) 01/05/2021   BMI 45.0-49.9, adult (HCC) 01/05/2021   Acute anemia 01/05/2021   Anal fissure 12/18/2020   History of lumbar spinal fusion 08/20/2019   Hypertensive disorder 08/20/2019   Depressive disorder 08/20/2019   Neuropathy 08/20/2019   Displacement of lumbar intervertebral disc without myelopathy 05/19/2018   Calcium nephrolithiasis 02/05/2016   NAFLD (nonalcoholic fatty liver disease) 28/31/5176   GAD (generalized anxiety disorder) 07/07/2015   Other specified behavioral and emotional disorders with onset usually occurring in childhood and  adolescence 10/16/2014   Low back pain 01/19/2013   OSA on CPAP 10/31/2012   Hyperlipidemia 03/31/2011   Benign essential hypertension 03/01/2011   Pilonidal cyst 11/10/2010    Past Surgical History:  Procedure Laterality Date   MOLE REMOVAL     ORIF RADIUS & ULNA FRACTURES     rods put in   RHINOPLASTY         Family History  Problem Relation Age of Onset   Hypertension Mother    Cancer Mother        cancer    Social History   Tobacco Use   Smoking status: Never   Smokeless tobacco: Never  Vaping Use   Vaping Use: Never used  Substance Use Topics   Alcohol use: Yes   Drug use: No    Home Medications Prior to Admission medications   Medication Sig Start Date End Date Taking? Authorizing Provider  buPROPion (WELLBUTRIN XL) 300 MG 24 hr tablet TAKE 1 TABLET BY MOUTH EVERY MORNING 05/22/20 05/22/21 Yes   ciprofloxacin (CIPRO) 500 MG tablet Take 1 tablet (500 mg total) by mouth 2 (two) times daily. 01/03/21  Yes Eustace Moore, MD  clotrimazole (LOTRIMIN) 1 % cream Apply topically 2 (two) times daily. 09/20/20  Yes [provider]  fenofibrate 160 MG tablet TAKE 1 TABLET BY MOUTH ONCE DAILY **NEED OFFICE VISIT FOR FURTHER REFILLS** 07/03/20 07/03/21 Yes Ardith Dark, MD  gabapentin (NEURONTIN) 300  MG capsule TAKE 1 CAPSULE BY MOUTH THREE TIMES DAILY Patient taking differently: Take 300-600 mg by mouth See admin instructions. Take one tablet by mouth in the morning, then take 2 tablets by mouth at night per patient 03/11/20 03/11/21 Yes Arman Filter, PA-C  hydrochlorothiazide (HYDRODIURIL) 25 MG tablet Take one tablet (25 mg dose) by mouth in the morning. 08/05/20  Yes   HYDROcodone-acetaminophen (NORCO/VICODIN) 5-325 MG tablet Take one tablet by mouth at bedtime as needed for severe pain only for up to 30 days. Patient taking differently: Take 1 tablet by mouth every 6 (six) hours as needed (Back pain). 12/03/20  Yes   lidocaine (LIDODERM) 5 % Wear patch for up to 12  hours then remove and discard patch or as directed by MD Patient taking differently: Place 1 patch onto the skin daily as needed (For back pain). 12/03/20  Yes   lisinopril (PRINIVIL,ZESTRIL) 40 MG tablet Take 40 mg by mouth daily.     Yes [provider]  methylphenidate (CONCERTA) 36 MG PO CR tablet Take 1 tablet by mouth every morning 10/27/20  Yes   methylphenidate (RITALIN) 5 MG tablet Take 1 tablet by mouth every evening as needed for extended focus 12/27/20  Yes   metroNIDAZOLE (FLAGYL) 500 MG tablet Take 1 tablet (500 mg total) by mouth 2 (two) times daily. 01/03/21  Yes Eustace Moore, MD  omega-3 acid ethyl esters (LOVAZA) 1 g capsule Take 1 g by mouth at bedtime.   Yes [provider]  tiZANidine (ZANAFLEX) 4 MG tablet TAKE 1 TABLET BY MOUTH EVERY 8 HOURS AS NEEDED Patient taking differently: Take 2 mg by mouth in the morning and at bedtime. 06/06/20 06/06/21 Yes Reynolds, Sarah B, PA-C  traMADol (ULTRAM) 50 MG tablet Take 1 to 2 tablets by mouth 3 times daily as needed for severe pain Patient taking differently: Take 100 mg by mouth 2 (two) times daily. 09/03/20  Yes   buPROPion (WELLBUTRIN XL) 300 MG 24 hr tablet TAKE 1 TABLET BY MOUTH EVERY MORNING Patient not taking: No sig reported 05/02/20 05/02/21    buPROPion (WELLBUTRIN XL) 300 MG 24 hr tablet Take one tablet by mouth once daily in the morning. Patient not taking: No sig reported 08/12/20     buPROPion (WELLBUTRIN XL) 300 MG 24 hr tablet Take 1 tablet by mouth every morning Patient not taking: No sig reported 10/27/20     fenofibrate 160 MG tablet Take one tablet (160 mg dose) by mouth daily. Patient not taking: No sig reported 08/12/20     gabapentin (NEURONTIN) 300 MG capsule Take 1 capsule (300 mg dose) by mouth 3 (three) times a day. Patient not taking: No sig reported 09/03/20     gabapentin (NEURONTIN) 300 MG capsule Take one capsule (300 mg dose) by mouth 3 (three) times a day. Patient not taking: No sig reported  12/03/20     lisinopril (ZESTRIL) 40 MG tablet TAKE 1 TABLET BY MOUTH ONCE DAILY Patient not taking: No sig reported 01/15/20 01/14/21  Ardith Dark, MD  lisinopril (ZESTRIL) 40 MG tablet Take one tablet (40 mg dose) by mouth daily. Patient not taking: No sig reported 12/12/20     methylphenidate (CONCERTA) 36 MG PO CR tablet Take 1 tablet by mouth every morning Patient not taking: No sig reported 11/27/20     methylphenidate (CONCERTA) 36 MG PO CR tablet Take 1 tablet by mouth every morning Patient not taking: No sig reported 12/27/20  methylphenidate (RITALIN) 5 MG tablet TAKE 1 TABLET BY MOUTH EVERY EVENING AS NEEDED FOR EXTENDED FOCUS 05/23/20 11/19/20  Cherly HensenAiken, Christopher B, MD  methylphenidate (RITALIN) 5 MG tablet TAKE 1 TABLET BY MOUTH EVERY EVENING AS NEEDED FOR EXTENDED FOCUS **DO NOT FILL SOONER THAN 60 DAYS FROM 05/23/20** 05/23/20 11/19/20  Cherly HensenAiken, Christopher B, MD  methylphenidate (RITALIN) 5 MG tablet TAKE 1 TABLET BY MOUTH EVERY EVENING AS NEEDED FOR EXTENDED FOCUS 05/23/20 11/19/20  Cherly HensenAiken, Christopher B, MD  methylphenidate 36 MG PO CR tablet TAKE 1 TABLET BY MOUTH EVERY MORNING 05/23/20 11/19/20  Cherly HensenAiken, Christopher B, MD  methylphenidate 36 MG PO CR tablet TAKE 1 TABLET BY MOUTH EVERY MORNING 05/23/20 11/19/20  Cherly HensenAiken, Christopher B, MD  methylphenidate 36 MG PO CR tablet TAKE 1 TABLET BY MOUTH EVERY MORNING **FILL 60 DAYS AFTER 05/23/20** 05/23/20 11/19/20  Cherly HensenAiken, Christopher B, MD  methylphenidate 36 MG PO CR tablet TAKE 1 TABLET BY MOUTH EVERY MORNING 02/29/20 08/27/20  Cherly HensenAiken, Christopher B, MD  methylphenidate 36 MG PO CR tablet TAKE 1 TABLET BY MOUTH EVERY MORNING 02/29/20 08/27/20  Cherly HensenAiken, Christopher B, MD  methylphenidate 36 MG PO CR tablet TAKE 1 TABLET BY MOUTH EVERY MORNING 02/29/20 08/27/20  Cherly HensenAiken, Christopher B, MD  methylphenidate 36 MG PO CR tablet Take one tablet by mouth daily. Patient not taking: No sig reported 08/12/20     methylphenidate 36 MG PO CR tablet Take one tablet by  mouth daily. Patient not taking: No sig reported 08/12/20     methylphenidate 36 MG PO CR tablet Take one tablet by mouth every morning. Patient not taking: No sig reported 08/12/20     tiZANidine (ZANAFLEX) 4 MG tablet TAKE 1 TABLET BY MOUTH EVERY 8 HOURS AS NEEDED Patient not taking: No sig reported 03/11/20 03/11/21  Thad Rangereynolds, Gaye AlkenSarah B, PA-C  tiZANidine (ZANAFLEX) 4 MG tablet Take one tablet (4 mg dose) by mouth every 8 (eight) hours as needed. Patient not taking: No sig reported 09/03/20     tiZANidine (ZANAFLEX) 4 MG tablet Take one tablet (4 mg dose) by mouth every 8 (eight) hours as needed. Patient not taking: No sig reported 12/03/20     traMADol (ULTRAM) 50 MG tablet Take 1 to 2 tablets by mouth (50-100 mg dose) by mouth up to 3 (three) times a day as needed for severe pain. Patient not taking: No sig reported 12/03/20       Allergies    Patient has no known allergies.  Review of Systems   Review of Systems  Constitutional:  Negative for appetite change.  HENT:  Negative for congestion.   Respiratory:  Negative for shortness of breath.   Cardiovascular:  Positive for syncope and near-syncope. Negative for chest pain.  Gastrointestinal:  Negative for abdominal pain.  Genitourinary:  Negative for flank pain.  Musculoskeletal:        Left toe pains.  Skin:  Positive for wound.  Neurological:  Positive for dizziness and light-headedness.  Psychiatric/Behavioral:  Negative for confusion.    Physical Exam Updated Vital Signs BP 113/72   Pulse 89   Temp 98.4 F (36.9 C) (Oral)   Resp 16   SpO2 98%   Physical Exam Vitals and nursing note reviewed.  HENT:     Head: Normocephalic.  Eyes:     Pupils: Pupils are equal, round, and reactive to light.  Cardiovascular:     Rate and Rhythm: Normal rate and regular rhythm.  Pulmonary:     Breath sounds:  No wheezing or rhonchi.  Abdominal:     Tenderness: There is no abdominal tenderness.  Skin:    General: Skin is warm.   Neurological:     General: No focal deficit present.     Mental Status: He is alert.    ED Results / Procedures / Treatments   Labs (all labs ordered are listed, but only abnormal results are displayed) Labs Reviewed  CBC WITH DIFFERENTIAL/PLATELET - Abnormal; Notable for the following components:      Result Value   RBC 2.62 (*)    Hemoglobin 8.0 (*)    HCT 24.7 (*)    All other components within normal limits  BASIC METABOLIC PANEL - Abnormal; Notable for the following components:   CO2 20 (*)    BUN 103 (*)    Creatinine, Ser 5.26 (*)    Calcium 8.7 (*)    GFR, Estimated 14 (*)    All other components within normal limits  HEMOGLOBIN AND HEMATOCRIT, BLOOD - Abnormal; Notable for the following components:   Hemoglobin 7.6 (*)    HCT 24.1 (*)    All other components within normal limits  IRON AND TIBC - Abnormal; Notable for the following components:   TIBC 465 (*)    Saturation Ratios 13 (*)    All other components within normal limits  FERRITIN - Abnormal; Notable for the following components:   Ferritin 497 (*)    All other components within normal limits  RETICULOCYTES - Abnormal; Notable for the following components:   Retic Ct Pct 3.3 (*)    RBC. 2.49 (*)    All other components within normal limits  HEPATIC FUNCTION PANEL - Abnormal; Notable for the following components:   Total Protein 6.2 (*)    Albumin 3.2 (*)    Alkaline Phosphatase 12 (*)    All other components within normal limits  SEDIMENTATION RATE - Abnormal; Notable for the following components:   Sed Rate 20 (*)    All other components within normal limits  C-REACTIVE PROTEIN - Abnormal; Notable for the following components:   CRP 1.4 (*)    All other components within normal limits  CBG MONITORING, ED - Abnormal; Notable for the following components:   Glucose-Capillary 106 (*)    All other components within normal limits  RESP PANEL BY RT-PCR (FLU A&B, COVID) ARPGX2  LACTIC ACID, PLASMA   VITAMIN B12  FOLATE  LACTATE DEHYDROGENASE  URINALYSIS, ROUTINE W REFLEX MICROSCOPIC  SODIUM, URINE, RANDOM  CREATININE, URINE, RANDOM  SAVE SMEAR (SSMR)  TSH  PATHOLOGIST SMEAR REVIEW  ANCA TITERS  ANA W/REFLEX IF POSITIVE  C4 COMPLEMENT  C3 COMPLEMENT  PROTEIN ELECTROPHORESIS, SERUM  UPEP/UIFE/LIGHT CHAINS/TP, 24-HR UR  BASIC METABOLIC PANEL  URINALYSIS, ROUTINE W REFLEX MICROSCOPIC  HIV ANTIBODY (ROUTINE TESTING W REFLEX)  COMPREHENSIVE METABOLIC PANEL  CBC  PROTIME-INR  APTT  HEMOGLOBIN AND HEMATOCRIT, BLOOD  POC OCCULT BLOOD, ED  TYPE AND SCREEN  ABO/RH  PREPARE RBC (CROSSMATCH)  TROPONIN I (HIGH SENSITIVITY)    EKG EKG Interpretation  Date/Time:  Monday January 05 2021 08:25:42 EDT Ventricular Rate:  90 PR Interval:  163 QRS Duration: 107 QT Interval:  326 QTC Calculation: 399 R Axis:   67 Text Interpretation: Sinus rhythm Low voltage, precordial leads Baseline wander No old tracing to compare Confirmed by Benjiman Core [phone]) on 01/05/2021 8:29:59 AM  Radiology US RENAL  Result Date: 01/05/2021 CLINICAL DATA:  Acute kidney injury. EXAM: RENAL / URINARY TRACT ULTRASOUND  COMPLETE COMPARISON:  None. FINDINGS: Right Kidney: Renal measurements: 14.3 x 7.4 x 6.2 cm = volume: 345 mL. Echogenicity within normal limits. No mass or hydronephrosis visualized. Left Kidney: Renal measurements: 16.0 x 7.5 x 8.0 cm = volume: 501 mL. Echogenicity within normal limits. A 12 mm shadowing stone present near the lower pole of the left kidney. No obstruction. No other focal lesions. Bladder: Appears normal for degree of bladder distention. Other: None. IMPRESSION: 1. Nonobstructing 12 mm stone at the lower pole of the left kidney. 2. Otherwise normal sonographic appearance of the kidneys bilaterally. Electronically Signed   By: Marin Roberts M.D.   On: 01/05/2021 11:38    Procedures Procedures   Medications Ordered in ED Medications  sodium chloride flush (NS) 0.9 %  injection 3 mL (3 mLs Intravenous Given 01/05/21 1217)  lactated ringers infusion ( Intravenous New Bag/Given 01/05/21 1702)  buPROPion (WELLBUTRIN XL) 24 hr tablet 300 mg (has no administration in time range)  gabapentin (NEURONTIN) capsule 300 mg (300 mg Oral Given 01/05/21 1527)  tiZANidine (ZANAFLEX) tablet 4 mg (has no administration in time range)  traMADol (ULTRAM) tablet 50 mg (50 mg Oral Given 01/05/21 1527)  lactated ringers bolus 1,000 mL (0 mLs Intravenous Stopped 01/05/21 0930)  lactated ringers bolus 1,000 mL (0 mLs Intravenous Stopped 01/05/21 1220)  lactated ringers bolus 1,000 mL (0 mLs Intravenous Stopped 01/05/21 1330)  lactated ringers bolus 1,000 mL (1,000 mLs Intravenous New Bag/Given 01/05/21 1401)  0.9 %  sodium chloride infusion (Manually program via Guardrails IV Fluids) ( Intravenous New Bag/Given 01/05/21 1638)    ED Course  I have reviewed the triage vital signs and the nursing notes.  Pertinent labs & imaging results that were available during my care of the patient were reviewed by me and considered in my medical decision making (see chart for details).  Clinical Course as of 01/05/21 1718  Mon Jan 05, 2021  0904 BP(!): 73/37 [MR]  0904 BP(!): 65/37 [MR]    Clinical Course User Index [MR] Redwine, Gabriel Cirri, PA-C   MDM Rules/Calculators/A&P                           Patient with syncopal episode/near syncope.  Found to be hypotensive.  Initially unclear why.  However found to have a hemoglobin of 8.  States a few weeks ago he had had anal fissure with slight bleeding but not amount of bleeding.  Guaiac negative.  Also has new renal failure.  Unsure why.  States he has been eating and drinking well.  Recently started on Cipro and Flagyl for pilonidal abscess.  Fluid boluses given.  With unsure etiology of the anemia not emergently transfused but will admit to internal medicine.  CRITICAL CARE Performed by: Benjiman Core Total critical care time: 30 minutes Critical  care time was exclusive of separately billable procedures and treating other patients. Critical care was necessary to treat or prevent imminent or life-threatening deterioration. Critical care was time spent personally by me on the following activities: development of treatment plan with patient and/or surrogate as well as nursing, discussions with consultants, evaluation of patient's response to treatment, examination of patient, obtaining history from patient or surrogate, ordering and performing treatments and interventions, ordering and review of laboratory studies, ordering and review of radiographic studies, pulse oximetry and re-evaluation of patient's condition.   Final Clinical Impression(s) / ED Diagnoses Final diagnoses:  AKI (acute kidney injury) (HCC)  Anemia, unspecified  type    Rx / DC Orders ED Discharge Orders     None        Benjiman Core, MD 01/05/21 1718

## 2021-01-05 NOTE — Progress Notes (Signed)
Consent for blood signed. 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 Awaiting for blood product.

## 2021-01-05 NOTE — H&P (Addendum)
Date: 01/05/2021               Patient Name:  Hunter [name] MRN: [id number]  DOB: 1985-06-22 Age / Sex: 35 y.o., male   PCP: Ernestine Conrad, MD         Medical Service: Internal Medicine Teaching Service         Attending Physician: Dr. Velna Ochs, MD    First Contact: Wayland Denis, MD Pager: [phone] [phone]  Second Contact: Rick Duff, MD Pager: [phone]       After Hours (After 5p/  First Contact Pager: [phone]  weekends / holidays): Second Contact Pager: [phone]   SUBJECTIVE  Chief Complaint: SYNCOPE  History of Present Illness: Hunter [name] is a 35 y.o. 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  Hunter [name] reports that he was in his usual state of health until this morning when he felt dizzy when getting out of his car in the parking lot.  While pre charting status, patient reports that he continued to feel lightheaded and dizzy and noted to have palpitations that was diaphoretic.  On standing, he had a syncopal episode in which he fell to the floor but did not lose consciousness.  He denies any head trauma.  He did report that he felt his bilateral arms twitching during this episode.  He denies any recent rashes, hematuria, decreased appetite. He denies any headaches, chest pain, nausea, vomiting, abdominal pain, diarrhea, melena, or urinary symptoms.    Patient was recently seen in the ED on 9/3 for pilonidal abscess for which she was prescribed ciprofloxacin and metronidazole twice daily.  He was advised to not take his descending colon ciprofloxacin.  He notes that he has seen specks of blood when wiping.  However denies any significant bleeding recently.  In the ED, the patient was noted to be afebrile but hypotensive with blood pressure 67/37, heart rate 79, saturating well on room air.  He was noted to  have a hemoglobin of 8.0 (baseline 14.5 in October 2021) without signs of acute GI bleed.  No leukocytosis.  BMP with BUN/creatinine elevated to 103/5.26.  Lactic acid within normal limits FOBT negative.  Patient given 2 L LR and admitted for further evaluation of his syncope, anemia, AKI.  Medications: No current facility-administered medications on file prior to encounter.   Current Outpatient Medications on File Prior to Encounter  Medication Sig Dispense Refill   buPROPion (WELLBUTRIN XL) 300 MG 24 hr tablet TAKE 1 TABLET BY MOUTH EVERY MORNING 90 tablet 1   ciprofloxacin (CIPRO) 500 MG tablet Take 1 tablet (500 mg total) by mouth 2 (two) times daily. 14 tablet 0   clotrimazole (LOTRIMIN) 1 % cream Apply topically 2 (two) times daily.     fenofibrate 160 MG tablet TAKE 1 TABLET BY MOUTH ONCE DAILY **NEED OFFICE VISIT FOR FURTHER REFILLS** 30 tablet 0   gabapentin (NEURONTIN) 300 MG capsule TAKE 1 CAPSULE BY MOUTH THREE TIMES DAILY (Patient taking differently: Take 300-600 mg by mouth See admin instructions. Take one tablet by mouth in the morning, then take 2 tablets by mouth at night per patient) 270 capsule 2   hydrochlorothiazide (HYDRODIURIL) 25 MG tablet Take one tablet (25 mg dose) by mouth in the morning. 30 tablet 5   HYDROcodone-acetaminophen (NORCO/VICODIN) 5-325 MG tablet Take one tablet by mouth at bedtime as needed for severe pain only  for up to 30 days. (Patient taking differently: Take 1 tablet by mouth every 6 (six) hours as needed (Back pain).) 15 tablet 0   lidocaine (LIDODERM) 5 % Wear patch for up to 12 hours then remove and discard patch or as directed by MD (Patient taking differently: Place 1 patch onto the skin daily as needed (For back pain).) 30 patch 2   lisinopril (PRINIVIL,ZESTRIL) 40 MG tablet Take 40 mg by mouth daily.       methylphenidate (CONCERTA) 36 MG PO CR tablet Take 1 tablet by mouth every morning 30 tablet 0   methylphenidate (RITALIN) 5 MG tablet Take 1  tablet by mouth every evening as needed for extended focus 30 tablet 0   omega-3 acid ethyl esters (LOVAZA) 1 g capsule Take 1 g by mouth at bedtime.     tiZANidine (ZANAFLEX) 4 MG tablet TAKE 1 TABLET BY MOUTH EVERY 8 HOURS AS NEEDED (Patient taking differently: Take 2 mg by mouth in the morning and at bedtime.) 90 tablet 2   traMADol (ULTRAM) 50 MG tablet Take 1 to 2 tablets by mouth 3 times daily as needed for severe pain (Patient taking differently: Take 100 mg by mouth 2 (two) times daily.) 180 tablet 2   buPROPion (WELLBUTRIN XL) 300 MG 24 hr tablet TAKE 1 TABLET BY MOUTH EVERY MORNING 90 tablet 0   buPROPion (WELLBUTRIN XL) 300 MG 24 hr tablet Take one tablet by mouth once daily in the morning. 90 tablet 1   buPROPion (WELLBUTRIN XL) 300 MG 24 hr tablet Take 1 tablet by mouth every morning 90 tablet 1   fenofibrate 160 MG tablet Take one tablet (160 mg dose) by mouth daily. 90 tablet 3   gabapentin (NEURONTIN) 300 MG capsule Take 1 capsule (300 mg dose) by mouth 3 (three) times a day. 270 capsule 2   gabapentin (NEURONTIN) 300 MG capsule Take one capsule (300 mg dose) by mouth 3 (three) times a day. 270 capsule 2   lisinopril (ZESTRIL) 40 MG tablet TAKE 1 TABLET BY MOUTH ONCE DAILY 330 tablet 0   lisinopril (ZESTRIL) 40 MG tablet Take one tablet (40 mg dose) by mouth daily. 90 tablet 2   methylphenidate (CONCERTA) 36 MG PO CR tablet Take 1 tablet by mouth every morning 30 tablet 0   methylphenidate (CONCERTA) 36 MG PO CR tablet Take 1 tablet by mouth every morning 30 tablet 0   methylphenidate (RITALIN) 5 MG tablet TAKE 1 TABLET BY MOUTH EVERY EVENING AS NEEDED FOR EXTENDED FOCUS 30 tablet 0   methylphenidate (RITALIN) 5 MG tablet TAKE 1 TABLET BY MOUTH EVERY EVENING AS NEEDED FOR EXTENDED FOCUS **DO NOT FILL SOONER THAN 60 DAYS FROM 05/23/20** 30 tablet 0   methylphenidate (RITALIN) 5 MG tablet TAKE 1 TABLET BY MOUTH EVERY EVENING AS NEEDED FOR EXTENDED FOCUS 30 tablet 0   methylphenidate 36  MG PO CR tablet TAKE 1 TABLET BY MOUTH EVERY MORNING 30 tablet 0   methylphenidate 36 MG PO CR tablet TAKE 1 TABLET BY MOUTH EVERY MORNING 30 tablet 0   methylphenidate 36 MG PO CR tablet TAKE 1 TABLET BY MOUTH EVERY MORNING **FILL 60 DAYS AFTER 05/23/20** 30 tablet 0   methylphenidate 36 MG PO CR tablet TAKE 1 TABLET BY MOUTH EVERY MORNING 30 tablet 0   methylphenidate 36 MG PO CR tablet TAKE 1 TABLET BY MOUTH EVERY MORNING 30 tablet 0   methylphenidate 36 MG PO CR tablet TAKE 1 TABLET BY MOUTH EVERY  MORNING 30 tablet 0   methylphenidate 36 MG PO CR tablet Take one tablet by mouth daily. 30 tablet 0   methylphenidate 36 MG PO CR tablet Take one tablet by mouth daily. 30 tablet 0   methylphenidate 36 MG PO CR tablet Take one tablet by mouth every morning. 30 tablet 0   metroNIDAZOLE (FLAGYL) 500 MG tablet Take 1 tablet (500 mg total) by mouth 2 (two) times daily. 14 tablet 0   Multiple Vitamin (MULTIVITAMIN) capsule Take 1 capsule by mouth daily.       tiZANidine (ZANAFLEX) 4 MG tablet TAKE 1 TABLET BY MOUTH EVERY 8 HOURS AS NEEDED 90 tablet 2   tiZANidine (ZANAFLEX) 4 MG tablet Take one tablet (4 mg dose) by mouth every 8 (eight) hours as needed. 90 tablet 2   tiZANidine (ZANAFLEX) 4 MG tablet Take one tablet (4 mg dose) by mouth every 8 (eight) hours as needed. 90 tablet 2   traMADol (ULTRAM) 50 MG tablet Take 1 to 2 tablets by mouth (50-100 mg dose) by mouth up to 3 (three) times a day as needed for severe pain. 180 tablet 2    Past Medical History:  Past Medical History:  Diagnosis Date   Back disorder    L3 spondelothesis   Hypertension    Kidney stones     Social:  Patient lives at home with his wife.  He works as a Programmer, applications at [employer].  He is independent in his ADLs.  No history of tobacco use, colitis, illicit substance use.  Family History: Family History  Problem Relation Age of Onset   Hypertension Mother    Cancer Mother        cancer    Allergies: Allergies as of  01/05/2021   (No Known Allergies)    Review of Systems: A complete ROS was negative except as per HPI.   OBJECTIVE:  Physical Exam: Blood pressure (!) 82/38, pulse 79, temperature 97.6 F (36.4 C), temperature source Oral, resp. rate 11, SpO2 100 %. Physical Exam  Constitutional: Young, obese male, no acute distress  HENT: Normocephalic and atraumatic, EOMI, conjunctiva normal, moist mucous membranes Cardiovascular: Normal rate, regular rhythm, S1 and S2 present, no murmurs, rubs, gallops.  Distal pulses intact Respiratory: No respiratory distress, no accessory muscle use.  Effort is normal.  Lungs are clear to auscultation bilaterally. GI: distended, soft, nontender to palpation, normal bowel sounds Musculoskeletal: Normal bulk and tone.  Trace pitting peripheral edema noted. Neurological: Is alert and oriented x4, no apparent focal deficits noted. Skin: Warm and dry.  No rash, erythema, lesions noted. Psychiatric: Normal mood and affect. Behavior is normal. Judgment and thought content normal.   Pertinent Labs: CBC    Component Value Date/Time   WBC 6.5 01/05/2021 0851   RBC 2.49 (L) 01/05/2021 1045   RBC 2.62 (L) 01/05/2021 0851   HGB 7.6 (L) 01/05/2021 1045   HCT 24.1 (L) 01/05/2021 1045   PLT 238 01/05/2021 0851   MCV 94.3 01/05/2021 0851   MCH 30.5 01/05/2021 0851   MCHC 32.4 01/05/2021 0851   RDW 12.9 01/05/2021 0851   LYMPHSABS 1.4 01/05/2021 0851   MONOABS 0.8 01/05/2021 0851   EOSABS 0.2 01/05/2021 0851   BASOSABS 0.0 01/05/2021 0851     CMP     Component Value Date/Time   NA 138 01/05/2021 0851   K 5.0 01/05/2021 0851   CL 106 01/05/2021 0851   CO2 20 (L) 01/05/2021 0851   GLUCOSE 91 01/05/2021  0851   BUN 103 (H) 01/05/2021 0851   CREATININE 5.26 (H) 01/05/2021 0851   CALCIUM 8.7 (L) 01/05/2021 0851   PROT 6.2 (L) 01/05/2021 1045   ALBUMIN 3.2 (L) 01/05/2021 1045   AST 25 01/05/2021 1045   ALT 41 01/05/2021 1045   ALKPHOS 12 (L) 01/05/2021 1045    BILITOT 0.6 01/05/2021 1045   GFRNONAA 14 (L) 01/05/2021 0851    Pertinent Imaging: US RENAL  Result Date: 01/05/2021 CLINICAL DATA:  Acute kidney injury. EXAM: RENAL / URINARY TRACT ULTRASOUND COMPLETE COMPARISON:  None. FINDINGS: Right Kidney: Renal measurements: 14.3 x 7.4 x 6.2 cm = volume: 345 mL. Echogenicity within normal limits. No mass or hydronephrosis visualized. Left Kidney: Renal measurements: 16.0 x 7.5 x 8.0 cm = volume: 501 mL. Echogenicity within normal limits. A 12 mm shadowing stone present near the lower pole of the left kidney. No obstruction. No other focal lesions. Bladder: Appears normal for degree of bladder distention. Other: None. IMPRESSION: 1. Nonobstructing 12 mm stone at the lower pole of the left kidney. 2. Otherwise normal sonographic appearance of the kidneys bilaterally. Electronically Signed   By: San Morelle M.D.   On: 01/05/2021 11:38    EKG: personally reviewed my interpretation is normal sinus rhythm, nonspecific ST and T waves changes  ASSESSMENT & PLAN:  Assessment: Principal Problem:   Acute renal failure (ARF) (HCC) Active Problems:   Benign essential hypertension   OSA on CPAP   Acute anemia   Hunter [name] is a 35 y.o. with pertinent PMH of hypertension, hyperlipidemia, severe obesity, depression/anxiety, chronic back pain who presented with syncope and admit for syncope on hospital day 0  Plan: #Syncope #Hypotension Patient presented with syncope in setting of hypotension. He was in his usual state of health until this morning when he was dizzy. He subsequently developed palpitations and diaphoresis and on standing, had a syncopal episode. He denies losing consciousness but did note bilateral upper extremity tremors during this episode. No head trauma. Noted to have systolic BP 06-26'R on arrival. He was started on fluid resuscitation with improvement to SBP 80-90's. He is mentating well. Physical exam is otherwise benign. No  lactic acidosis. No recent fevers/chills, decreased oral intake, headaches. Suspect vasovagal etiology. Did have acute drop in hemoglobin; however, denies any GI symptoms. No recent abdominal trauma.  - 2L LR bolus + LR 250cc/hr x24 hrs - Troponin pending; if positive, can consider Echo  - Cardiac monitoring  #Acute renal failure Patient noted to have acute elevation in BUN/sCr 103/5.26 (baseline ~0.7). He does have a remote history of renal stones; however, none recent. He does note prior history of AKI with similar elevation in sCr in setting of dehydration with GI illness. Renal US with nonobstructing renal stone. Urinalysis without hemoglobinuria, leukocytes, proteinuria, or ketonuria. FeNa 3.5% consistent with intrinsic etiology possible ATN in setting of hypotension vs glomerulonephritis.  - Nephrology consulted, appreciate recommendations  - IV fluid resuscitation as above  - ESR, CRP, ANCA, ANA, C3/C4 complement, SPEP/UPEP/IFE/Light Chains - Trend renal function   #Symptomatic Anemia Patient noted to have acute anemia with Hb 8.0 on arrival (14.5 in October 2021). He denies any changes in bowel habits including diarrhea, dark tarry stools, or hematochezia or nausea. He denies any abdominal trauma. Iron studies with decreased iron saturation but elevated ferritin, more consistent with anemia of chronic disease. Absolute retic count 1.9 and Retic index 0.97, consistent with hypoproliferative etiology. Initial concern for hemolysis; however, T.biliribin and LDH wnl. Given  that he is symptomatic, patient to be transfused 1u pRBC.  - Transfuse 1u pRBC - Trend CBC  #Hx of hypertension Patient is on lisinopril and HCTZ at baseline. Will hold in setting of hypotension as above.  #Chronic back pain This is in setting of L3 spondelolisthesis. Patient is on gabapentin and tramadol at baseline. - Continue pain regimen   Best Practice: Diet: Cardiac diet IVF: Fluids: LR, Rate:  250 cc/hr x 24  hrs VTE: None Code: Full AB: None Status: Inpatient with expected length of stay greater than 2 midnights. Anticipated Discharge Location: Home Barriers to Discharge: Medical stability  Signature: Harvie Heck, MD Internal Medicine Resident, PGY-3 [facility]  Pager: [phone] 12:59 PM, 01/05/2021   Please contact the on call pager after 5 pm and on weekends at [phone].

## 2021-01-05 NOTE — ED Triage Notes (Signed)
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 Denies hitting head, only pain is in L toe.

## 2021-01-05 NOTE — ED Notes (Signed)
Dr Rubin Payor aware of pt BP

## 2021-01-05 NOTE — ED Triage Notes (Signed)
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 Pt was sitting up on the floor with tech and cafeteria staff when found by PT. Pt reports dizziness and fall as he was coming into to work. Hx HTN and took meds today. Pt placed in wheelchair and brought to ED. Pt a/o but dizzy.

## 2021-01-06 ENCOUNTER — Encounter (HOSPITAL_COMMUNITY): Payer: Self-pay | Admitting: Student in an Organized Health Care Education/Training Program

## 2021-01-06 DIAGNOSIS — I959 Hypotension, unspecified: Secondary | ICD-10-CM | POA: Diagnosis not present

## 2021-01-06 DIAGNOSIS — D649 Anemia, unspecified: Secondary | ICD-10-CM | POA: Diagnosis not present

## 2021-01-06 DIAGNOSIS — N179 Acute kidney failure, unspecified: Secondary | ICD-10-CM | POA: Diagnosis not present

## 2021-01-06 LAB — BASIC METABOLIC PANEL
Anion gap: 10 (ref 5–15)
BUN: 83 mg/dL — ABNORMAL HIGH (ref 6–20)
CO2: 21 mmol/L — ABNORMAL LOW (ref 22–32)
Calcium: 8.8 mg/dL — ABNORMAL LOW (ref 8.9–10.3)
Chloride: 106 mmol/L (ref 98–111)
Creatinine, Ser: 4.1 mg/dL — ABNORMAL HIGH (ref 0.61–1.24)
GFR, Estimated: 19 mL/min — ABNORMAL LOW (ref >60)
Glucose, Bld: 105 mg/dL — ABNORMAL HIGH (ref 70–99)
Potassium: 4.8 mmol/L (ref 3.5–5.1)
Sodium: 137 mmol/L (ref 135–145)

## 2021-01-06 LAB — CBC
HCT: 27.1 % — ABNORMAL LOW (ref 39.0–52.0)
Hemoglobin: 8.8 g/dL — ABNORMAL LOW (ref 13.0–17.0)
MCH: 30.1 pg (ref 26.0–34.0)
MCHC: 32.5 g/dL (ref 30.0–36.0)
MCV: 92.8 fL (ref 80.0–100.0)
Platelets: 213 10*3/uL (ref 150–400)
RBC: 2.92 MIL/uL — ABNORMAL LOW (ref 4.22–5.81)
RDW: 13.1 % (ref 11.5–15.5)
WBC: 5 10*3/uL (ref 4.0–10.5)
nRBC: 0 % (ref 0.0–0.2)

## 2021-01-06 LAB — TYPE AND SCREEN
Antibody Screen: NEGATIVE
PT AG Type: POSITIVE
Unit division: 0

## 2021-01-06 LAB — COMPREHENSIVE METABOLIC PANEL
ALT: 45 U/L — ABNORMAL HIGH (ref 0–44)
AST: 30 U/L (ref 15–41)
Albumin: 3.6 g/dL (ref 3.5–5.0)
Alkaline Phosphatase: 13 U/L — ABNORMAL LOW (ref 38–126)
Anion gap: 9 (ref 5–15)
BUN: 81 mg/dL — ABNORMAL HIGH (ref 6–20)
CO2: 25 mmol/L (ref 22–32)
Calcium: 9.3 mg/dL (ref 8.9–10.3)
Chloride: 106 mmol/L (ref 98–111)
Creatinine, Ser: 4.72 mg/dL — ABNORMAL HIGH (ref 0.61–1.24)
GFR, Estimated: 16 mL/min — ABNORMAL LOW (ref >60)
Glucose, Bld: 91 mg/dL (ref 70–99)
Potassium: 4.8 mmol/L (ref 3.5–5.1)
Sodium: 140 mmol/L (ref 135–145)
Total Bilirubin: 0.6 mg/dL (ref 0.3–1.2)
Total Protein: 6.8 g/dL (ref 6.5–8.1)

## 2021-01-06 LAB — BPAM RBC
Blood Product Expiration Date: 202210062359
ISSUE DATE / TIME: 202209051644
Unit Type and Rh: 5100

## 2021-01-06 LAB — DIRECT ANTIGLOBULIN TEST (NOT AT ARMC)
DAT, IgG: NEGATIVE
DAT, complement: NEGATIVE

## 2021-01-06 LAB — HEMOGLOBIN AND HEMATOCRIT, BLOOD
HCT: 24.5 % — ABNORMAL LOW (ref 39.0–52.0)
HCT: 26.6 % — ABNORMAL LOW (ref 39.0–52.0)
Hemoglobin: 8.1 g/dL — ABNORMAL LOW (ref 13.0–17.0)
Hemoglobin: 8.8 g/dL — ABNORMAL LOW (ref 13.0–17.0)

## 2021-01-06 LAB — URINALYSIS, ROUTINE W REFLEX MICROSCOPIC
Bilirubin Urine: NEGATIVE
Glucose, UA: 100 mg/dL — AB
Ketones, ur: NEGATIVE mg/dL
Leukocytes,Ua: NEGATIVE
Nitrite: NEGATIVE
Protein, ur: NEGATIVE mg/dL
Specific Gravity, Urine: 1.01 (ref 1.005–1.030)
pH: 7.5 (ref 5.0–8.0)

## 2021-01-06 LAB — APTT: aPTT: 27 seconds (ref 24–36)

## 2021-01-06 LAB — URINALYSIS, MICROSCOPIC (REFLEX)
Bacteria, UA: NONE SEEN
Squamous Epithelial / HPF: NONE SEEN (ref 0–5)

## 2021-01-06 LAB — HIV ANTIBODY (ROUTINE TESTING W REFLEX): HIV Screen 4th Generation wRfx: NONREACTIVE

## 2021-01-06 LAB — PROTIME-INR
INR: 1.2 (ref 0.8–1.2)
Prothrombin Time: 14.9 seconds (ref 11.4–15.2)

## 2021-01-06 LAB — C3 COMPLEMENT: C3 Complement: 158 mg/dL (ref 82–167)

## 2021-01-06 LAB — C4 COMPLEMENT: Complement C4, Body Fluid: 25 mg/dL (ref 12–38)

## 2021-01-06 MED ORDER — LACTATED RINGERS IV SOLN: INTRAVENOUS | Status: AC

## 2021-01-06 MED ORDER — LACTATED RINGERS IV BOLUS
1000.0000 mL | Freq: Once | INTRAVENOUS | Status: AC
  Administered 2021-01-06: 1000 mL via INTRAVENOUS

## 2021-01-06 MED ORDER — NOREPINEPHRINE 4 MG/250ML-% IV SOLN
2.0000 ug/min | INTRAVENOUS | Status: DC
  Filled 2021-01-06: qty 250

## 2021-01-06 MED ORDER — SODIUM CHLORIDE 0.9 % IV SOLN: 250.0000 mL | INTRAVENOUS | Status: DC

## 2021-01-06 MED ORDER — CHLORHEXIDINE GLUCONATE CLOTH 2 % EX PADS
6.0000 | MEDICATED_PAD | Freq: Every day | CUTANEOUS | Status: DC
  Administered 2021-01-07: 6 via TOPICAL

## 2021-01-06 NOTE — Progress Notes (Signed)
Patient's vital signs' mews turned to yellow. Discussed patient's conditions with another RN and notified MD Eber Jones. Will continue monitor.

## 2021-01-06 NOTE — Progress Notes (Signed)
Patient transfer to the 46M as per MD ordered. Report given to the RN.

## 2021-01-06 NOTE — Progress Notes (Addendum)
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 He was doing well when we saw him this morning and had no pain, dizziness, shortness of breath, or chest pain. His only complaint was urinary frequency, which was secondary to IV fluids, but he had no issues getting up to use the restroom.  Objective:  Vital signs in last 24 hours: Vitals:   01/05/21 1928 01/05/21 1936 01/05/21 2355 01/06/21 0439  BP:  1_0  Pulse:  92 64 73  Resp:  _1 Temp:  98.9 F (37.2 C) 98.5 F (36.9 C) 98.3 F (36.8 C)  TempSrc: Oral Oral Oral Oral  SpO2:  99% 97% 100%  Weight:    (!) 158.5 kg   Weight change:   Intake/Output Summary (Last 24 hours) at 01/06/2021 1139 Last data filed at 01/06/2021 0944 Gross per 24 hour  Intake 4987.51 ml  Output 9100 ml  Net -4112.49 ml   Physical Exam Vitals reviewed.  Constitutional:      General: He is not in acute distress.    Appearance: Normal appearance. He is obese.  HENT:     Head: Normocephalic and atraumatic.  Cardiovascular:     Rate and Rhythm: Normal rate and regular rhythm.     Heart sounds: Normal heart sounds.  Pulmonary:     Effort: Pulmonary effort is normal. No respiratory distress.     Breath sounds: Normal breath sounds. No wheezing.  Abdominal:     General: Abdomen is flat.     Palpations: Abdomen is soft.  Musculoskeletal:        General: Normal range of motion.  Skin:    Comments: No erythema or warmth surrounding the pilonidal abscess. Mild induration present.  Neurological:     General: No focal deficit present.     Mental Status: He is alert and oriented to person, place, and time.  Psychiatric:        Mood and Affect: Mood normal.        Behavior: Behavior normal.        Thought  Content: Thought content normal.    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   #Syncope/#Hypotension The patient presented with syncope in the setting of hypotension. He was started on fluid resuscitation, and his SBPs improved. However, his blood pressure decreased again to 73/40 at 1203. Orthostatics were being completed shortly after, but the patient felt too dizzy to stand. Lying BP was 81/38 and sitting was 63/44. - LR 1037m bolus given - Nephrology decreased rate to 781mhr from 250110mr - Repeat orthostatics - Hold home BP meds  #Acute Renal Failure The patient had an elevated BUN and sCr. FeNa 3.5% consistent with intrinsic etiology of AKI, and he likely has ATN in the setting of hypotension. ESR, CRP, ANCA, AMA, C3/C4 complement, SPEP/UPUP/IFE/Light Chains were ordered. CRP was elevated to 1.4, and ESR was elevated to 20, but studies were otherwise normal. Renal ultrasound showed nonobstructive kidney  stone. - Nephrology consulted, appreciate recommendations  #Symptomatic Anemia Patient had acute anemia on arrival. His Hgb was 8.0 on arrival. 5 months ago, it was 14. He was transfused 1u pRBCs yesterday. Labs indicate there is no hemolysis and no iron deficiency anemia. If anemia persists, may need to refer for a bone marrow biopsy to rule out myelodysplasia or red cell aplasia. - Hematology consulted - Trend CBC  #Pilonidal abscess The patient has a pilonidal abscess. He just began a 7 day course of ciprofloxacin and flagyl on 9/3. - Antibiotics have been discontinued  #Hypertension Patient takes lisinopril  and HCTZ and has had the same regimen for over 5 years. - Hold home BP meds in the setting of hypotension  #Chronic back pain Patient has L3 spondylolisthesis. He is on gabapentin and tramadol at baseline. - Continue pain regimen  Best Practice: Diet: Cardiac diet IVF: LR 60m/hr VTE: None Code: Full AB: None Status: Inpatient with expected length of stay greater than 2 midnights. Anticipated Discharge Location: Home Barriers to Discharge: Medical stability   LOS: 1 day   SDanie Chandler Medical Student 01/06/2021, 11:39 AM    Attestation for Student Documentation:  I personally was present and performed or re-performed the history, physical exam and medical decision-making activities of this service and have verified that the service and findings are accurately documented in the student's note.  DFarrel Gordon DO 01/06/2021, 3:18 PM

## 2021-01-06 NOTE — Progress Notes (Signed)
Patient's BP was lows 73/40. Patient is alert and oriented. MD Eber Jones notified and given LR bolus 1000 ml. Recheck BP was 83/52.Patient has no symptoms of pain and discomfort. MD has notified. Will continue monitor.

## 2021-01-06 NOTE — Hospital Course (Addendum)
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  #Syncope #Hypotension Patient presented with syncope in setting of hypotension. He was in his usual state of health until the morning of 9/5, when he became dizzy when getting out of his car. He subsequently developed palpitations and diaphoresis and on standing, had a syncopal episode. He denies losing consciousness but did note bilateral upper extremity tremors during this episode. No head trauma. Noted to have systolic BP 59-93'T on arrival. He was started on fluid resuscitation (2L LR bolus + LR 250cc/hr x24hrs) with improvement to SBP 80-90's. He was mentating well. Physical exam is otherwise benign. No lactic acidosis. No recent fevers/chills, decreased oral intake, headaches. Did have acute drop in hemoglobin, but denied any GI symptoms. No recent abdominal trauma. Troponins were negative. On 9/5, his hemoglobin was 8 s/p 1 unit pRBCs. On 9/6, his blood pressure decreased again to 73/40 at 1203. Orthostatics were being completed shortly after, but the patient felt too dizzy to stand. Lying BP was 81/38 and sitting was 63/44. Cuff was checked for accuracy. LR was restarted, and nephrology decreased the rate from 225m/hr to 719mhr. He was transferred to the ICU and did not require pressure support. Pressures improved with IVF, and on 9/7, the hypotension resolved.    #Acute renal failure Patient noted to have acute elevation in BUN/sCr 103/5.26 (baseline ~0.7). He does have a remote history of renal stones; however, none recent. He does note prior history of AKI with similar elevation in sCr in setting of dehydration with GI illness. Renal [place] nonobstructing renal stone. Urinalysis without hemoglobinuria, leukocytes, proteinuria, or  ketonuria. FeNa 3.5% consistent with intrinsic etiology possible ATN in setting of hypotension vs glomerulonephritis. ESR, CRP, ANCA, AMA, C3/C4 complement, SPEP/UPUP/IFE/Light Chains were ordered. CRP was elevated to 1.4, and ESR was elevated to 20, but studies were otherwise normal. Nephrology was consulted. Mild elevation of kappa free light chains likely related to renal insufficiency. Patient received IV fluids to match ongoing losses as he was recovering from acute renal failure.   #Symptomatic Anemia Patient noted to have acute anemia with Hb 8.0 on arrival (14.5 in October 2021). He denied any changes in bowel habits including diarrhea, dark tarry stools, or hematochezia or nausea. He denied any abdominal trauma. Iron studies with decreased iron saturation but elevated ferritin, more consistent with anemia of chronic disease. Absolute retic count 1.9 and Retic index 0.97, consistent with hypoproliferative etiology. Initial concern for hemolysis; however, T.biliribin and LDH wnl. Given that he was symptomatic, he was transfused 1u pRBC on 9/5. Mild improvement in his hemoglobin to 8.8, HCT to 27.1, and RBC to 2.92 on 9/6. Medical oncology was consulted. FOBT was negative. Patient continued feeling well. No complaints of bleeding. IR was consulted for possible bone marrow biopsy. Blood smear showed a few ovalocytes and teardrops but was otherwise unremarkable. Medical oncology told the patient the bone marrow biopsy could be cancelled, but the patient wanted to proceed. Mild elevation of kappa free light chains likely related to renal insufficiency, and myeloma panel pending. Bone marrow biopsy performed.  #Pilonidal abscess Patient has a pilonidal abscess. On 9/3, he began a 7 day course of ciprofloxacin and flagyl, but antibiotics were discontinued.  #Hx of hypertension Patient takes lisinopril and HCTZ at baseline. Held in the setting of hypotension.   #  Chronic back pain Patient has L3  spondelolisthesis. Patient is on gabapentin and tramadol at baseline to manage the pain. Pain regimen has been continued during the hospitalization.

## 2021-01-06 NOTE — Consult Note (Addendum)
[place]  [phone] Fax:([phone]    [place]  Referring MD:  Dr. 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 He presented to the ED with syncope.  While charting, he developed lightheadedness and dizziness and started to have palpitations and felt diaphoretic.  He had a syncopal episode in which she fell to the floor but did not lose consciousness.  He was recently seen in the emergency department on 9/3 for a pilonidal abscess for which he was prescribed ciprofloxacin and metronidazole twice a day.  In the emergency department, he was hypotensive.  His hemoglobin was 8.0 (baseline 14.8 on 08/05/2020).  BUN/creatinine were elevated to 103/5.26.  Fecal occult blood testing was negative.  He was on lisinopril and HCTZ prior to this admission.  Nephrology has been consulted this admission and felt AKI was due to reduce renal perfusion.  He has received 1 unit PRBCs this admission.  He reports feeling well this time.  He is receiving a fluid bolus due to hypotension.  He denies any recent fevers or chills.  No recent night sweats.  He is not currently having any headaches or dizziness.  Denies chest pain, shortness of breath. Denies bone pain. He denies abdominal pain, nausea, vomiting.  He has been treated recently with antibiotics for a pilonidal abscess which he states has improved. The patient is married and has a daughter.  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  His father has a diagnosis of MDS.  Hematology was asked see the patient recommendations regarding his anemia.  Past Medical History:  Diagnosis Date   Back disorder    L3 spondelothesis   Hypertension    Kidney stones   :     Past Surgical History:  Procedure Laterality  Date   MOLE REMOVAL     ORIF RADIUS & ULNA FRACTURES     rods put in   RHINOPLASTY    :   CURRENT MEDS: Current Facility-Administered Medications  Medication Dose Route Frequency Provider Last Rate Last Admin   buPROPion (WELLBUTRIN XL) 24 hr tablet 300 mg  300 mg Oral q morning Aslam, Sadia, MD   300 mg at 01/06/21 0934   gabapentin (NEURONTIN) capsule 300 mg  300 mg Oral TID Harvie Heck, MD   300 mg at 01/06/21 0929   sodium chloride flush (NS) 0.9 % injection 3 mL  3 mL Intravenous Q12H Aslam, Loralyn Freshwater, MD   3 mL at 01/05/21 2122   tiZANidine (ZANAFLEX) tablet 4 mg  4 mg Oral Q8H PRN Harvie Heck, MD   4 mg at 01/06/21 1046   traMADol (ULTRAM) tablet 50 mg  50 mg Oral TID PRN Harvie Heck, MD   50 mg at 01/06/21 0935      No Known Allergies:   Family History  Problem Relation Age of Onset   Hypertension Mother    Cancer Mother        cancer  :   Social History   Socioeconomic History   Marital status: Single    Spouse name: Not on file   Number of children: Not on file   Years of education: Not on file   Highest education level: Not on file  Occupational History   Not on file  Tobacco Use   Smoking status: Never  Smokeless tobacco: Never  Vaping Use   Vaping Use: Never used  Substance and Sexual Activity   Alcohol use: Yes   Drug use: No   Sexual activity: Yes  Other Topics Concern   Not on file  Social History Narrative   Not on file   Social Determinants of Health   Financial Resource Strain: Not on file  Food Insecurity: Not on file  Transportation Needs: Not on file  Physical Activity: Not on file  Stress: Not on file  Social Connections: Not on file  Intimate Partner Violence: Not on file  :  REVIEW OF SYSTEMS:  A comprehensive 14 point review of systems was negative except as noted in the HPI.    Exam: Patient Vitals for the past 24 hrs:  BP Temp Temp src Pulse Resp SpO2 Weight  01/06/21 1203 (!) 73/40 98.4 F (36.9 C) Oral 74 18 95 % --   01/06/21 0439 94/61 98.3 F (36.8 C) Oral 73 16 100 % (!) 158.5 kg  01/05/21 2355 90/63 98.5 F (36.9 C) Oral 64 18 97 % --  01/05/21 1936 123/62 98.9 F (37.2 C) Oral 92 18 99 % --  01/05/21 1928 -- -- Oral -- -- -- --  01/05/21 1710 113/72 98.4 F (36.9 C) Oral 89 16 98 % --  01/05/21 1638 126/87 98.9 F (37.2 C) Oral 92 16 99 % --  01/05/21 1435 (!) 108/56 98.7 F (37.1 C) Oral 86 16 99 % --  01/05/21 1345 (!) 91/34 -- -- 87 15 100 % --  01/05/21 1330 (!) 102/41 -- -- 85 14 100 % --  01/05/21 1315 (!) 95/48 -- -- 88 18 99 % --  01/05/21 1300 (!) 92/50 -- -- 83 17 100 % --    General:  well-nourished in no acute distress.   Eyes:  no scleral icterus.   ENT:  There were no oropharyngeal lesions.    Lymphatics:  Negative cervical, supraclavicular, axillary, inguinal adenopathy.   Respiratory: lungs were clear bilaterally without wheezing or crackles.   Cardiovascular:  Regular rate and rhythm, S1/S2, without murmur, rub or gallop.  There was no pedal edema.   GI:  abdomen was soft, flat, nontender, nondistended, without organomegaly.   Musculoskeletal:  strength symmetrical in the upper and lower extremities.   Skin: Mild erythema surrounding pilonidal abscess, appears to have tunneling.   Neuro exam was nonfocal.  Patient was alert and oriented.  Attention was good.   Language was appropriate.  Mood was normal without depression.  Speech was not pressured.  Thought content was not tangential.    LABS:  Lab Results  Component Value Date   WBC 5.0 01/06/2021   HGB 8.8 (L) 01/06/2021   HCT 27.1 (L) 01/06/2021   PLT 213 01/06/2021   GLUCOSE 91 01/06/2021   ALT 45 (H) 01/06/2021   AST 30 01/06/2021   NA 140 01/06/2021   K 4.8 01/06/2021   CL 106 01/06/2021   CREATININE 4.72 (H) 01/06/2021   BUN 81 (H) 01/06/2021   CO2 25 01/06/2021   INR 1.2 01/06/2021    US RENAL  Result Date: 01/05/2021 CLINICAL DATA:  Acute kidney injury. EXAM: RENAL / URINARY TRACT ULTRASOUND  COMPLETE COMPARISON:  None. FINDINGS: Right Kidney: Renal measurements: 14.3 x 7.4 x 6.2 cm = volume: 345 mL. Echogenicity within normal limits. No mass or hydronephrosis visualized. Left Kidney: Renal measurements: 16.0 x 7.5 x 8.0 cm = volume: 501 mL. Echogenicity within normal limits. A  12 mm shadowing stone present near the lower pole of the left kidney. No obstruction. No other focal lesions. Bladder: Appears normal for degree of bladder distention. Other: None. IMPRESSION: 1. Nonobstructing 12 mm stone at the lower pole of the left kidney. 2. Otherwise normal sonographic appearance of the kidneys bilaterally. Electronically Signed   By: San Morelle M.D.   On: 01/05/2021 11:38    Peripheral Smear review 01/06/2021: Platelets appear normal in number.  No platelet clumps.  One myelocyte, white cells otherwise unremarkable.  Moderate number of ovalocytes and teardrops.  No nucleated red cells.  Polychromasia not increased.   ASSESSMENT AND PLAN:  1.  Normocytic anemia 2.  ARF 3.  Syncope/hypotension 4.  Pilonidal abscess 5.  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  On admission, he was found to have anemia and acute renal failure.  Work-up reviewed and he does not have any evidence of iron deficiency, vitamin B12 deficiency, or folate deficiency.  There is no evidence of hemolysis.  Findings could be concerning for an underlying bone marrow process such as multiple myeloma, lymphoma, leukemia, or other bone marrow process.  Recommend additional work-up including a myeloma panel, light chains, and bone marrow biopsy.  Work-up has been discussed with the patient and he agrees to proceed.  Recommend management of pilonidal abscess per primary team.  Recommendations: Obtain myeloma panel, light chains, DAT, and bone marrow biopsy Management of pilonidal abscess per primary team Management of hypotension, evaluate for infection  per critical care medicine, consider CT pelvis to look for a perineal abscess Consider starting broad-spectrum intravenous antibiotics  Thank you for this referral.  Mikey Bussing, DNP, AGPCNP-BC, AOCNP Dr.Hally was interviewed and examined.  I reviewed the peripheral blood smear.  He was admitted following a syncope event.  He has severe anemia.  There is no evidence of bleeding or hemolysis.  We are concerned he may have primary bone marrow failure secondary to a hematopoietic process such as leukemia, myeloma, or lymphoma.  He may have a systemic infection causing hypotension.  He appears to have an infection at the upper gluteal fold.  The anemia could be related to renal failure, but I think this is unlikely.  Renal failure may be from hypotension/sepsis, myeloma, or less likely an infiltrative renal process.  I recommend proceeding with a diagnostic bone marrow biopsy.  Management of hypotension and the apparent perineal infection per critical care medicine.  Hematology will continue following him with the medical and critical care services.  I was present for greater than 50% of today's visit.  I performed medical decision making.  Julieanne Manson, MD

## 2021-01-06 NOTE — Progress Notes (Signed)
Spoke with onc, will start cellulitis tx pending clear source of hypotension.

## 2021-01-06 NOTE — Progress Notes (Signed)
[facility] NEPHROLOGY PROGRESS NOTE  Assessment/ Plan: Pt is a 35 y.o. 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 BP 67/37 in ER. On lisinopril and HCTZ at home.  #Acute kidney injury, nonoliguric likely hemodynamically mediated in the setting of hypotension causing reduced renal perfusion.  UA and kidney ultrasound unremarkable.  Received IV fluid with initial improvement of creatinine level down to 4.1.  Noted the creatinine level slightly went up to 4.7 this morning probably due to high urine output.  He had around 6.5 L of urine output overnight.  Lowering IV fluid rate today, encourage oral intake and repeat lab in the morning.  Expect renal recovery.  No need for dialysis.    #Syncope/symptomatic hypotension in the setting of lisinopril/HCTZ use.  Patient reports palpitation last few days and had syncopal episode on the day of admission.  Discontinue antihypertensive.   #Anemia: FOBT negative.  Received a unit of blood transfusion.  No sign of TMA.  Further evaluation per primary team.   #Chronic back pain: Avoid NSAIDs.  Discussed with the primary team.  Subjective: Seen and examined.  He reports feeling good with increased urination.  He had urine output of 6.5 L.  Denies nausea, vomiting, chest pain, shortness of breath. Objective Vital signs in last 24 hours: Vitals:   01/05/21 1928 01/05/21 1936 01/05/21 2355 01/06/21 0439  BP:  123/62 90/63 94/61   Pulse:  92 64 73  Resp:  18 18 16   Temp:  98.9 F (37.2 C) 98.5 F (36.9 C) 98.3 F (36.8 C)  TempSrc: Oral Oral Oral Oral  SpO2:  99% 97% 100%  Weight:    (!) 158.5 kg   Weight change:   Intake/Output Summary (Last 24 hours) at 01/06/2021 0933 Last data filed at 01/06/2021 0806 Gross per 24 hour  Intake 4987.51 ml   Output 7850 ml  Net -2862.49 ml       Labs: Basic Metabolic Panel: Recent Labs  Lab 01/05/21 0851 01/05/21 2307 01/06/21 0627  NA 138 137 140  K 5.0 4.8 4.8  CL 106 106 106  CO2 20* 21* 25  GLUCOSE 91 105* 91  BUN 103* 83* 81*  CREATININE 5.26* 4.10* 4.72*  CALCIUM 8.7* 8.8* 9.3   Liver Function Tests: Recent Labs  Lab 01/05/21 1045 01/06/21 0627  AST 25 30  ALT 41 45*  ALKPHOS 12* 13*  BILITOT 0.6 0.6  PROT 6.2* 6.8  ALBUMIN 3.2* 3.6   No results for input(s): LIPASE, AMYLASE in the last 168 hours. No results for input(s): AMMONIA in the last 168 hours. CBC: Recent Labs  Lab 01/05/21 0851 01/05/21 1045 01/05/21 2307 01/06/21 0627  WBC 6.5  --   --  5.0  NEUTROABS 4.1  --   --   --   HGB 8.0* 7.6* 8.1* 8.8*  HCT 24.7* 24.1* 24.5* 27.1*  MCV 94.3  --   --  92.8  PLT 238  --   --  213   Cardiac Enzymes: No results for input(s): CKTOTAL, CKMB, CKMBINDEX, TROPONINI in the last 168 hours. CBG: Recent Labs  Lab 01/05/21 0825  GLUCAP 106*    Iron Studies:  Recent Labs    01/05/21 1045  IRON 62  TIBC 465*  FERRITIN 497*   Studies/Results: 03/07/21 RENAL  Result Date: 01/05/2021 CLINICAL  DATA:  Acute kidney injury. EXAM: RENAL / URINARY TRACT ULTRASOUND COMPLETE COMPARISON:  None. FINDINGS: Right Kidney: Renal measurements: 14.3 x 7.4 x 6.2 cm = volume: 345 mL. Echogenicity within normal limits. No mass or hydronephrosis visualized. Left Kidney: Renal measurements: 16.0 x 7.5 x 8.0 cm = volume: 501 mL. Echogenicity within normal limits. A 12 mm shadowing stone present near the lower pole of the left kidney. No obstruction. No other focal lesions. Bladder: Appears normal for degree of bladder distention. Other: None. IMPRESSION: 1. Nonobstructing 12 mm stone at the lower pole of the left kidney. 2. Otherwise normal sonographic appearance of the kidneys bilaterally. Electronically Signed   By: Marin Roberts M.D.   On: 01/05/2021 11:38     Medications: Infusions:  lactated ringers 250 mL/hr at 01/06/21 0522    Scheduled Medications:  buPROPion  300 mg Oral q morning   gabapentin  300 mg Oral TID   sodium chloride flush  3 mL Intravenous Q12H    have reviewed scheduled and prn medications.  Physical Exam: General:NAD, comfortable Heart:RRR, s1s2 nl Lungs:clear b/l, no crackle Abdomen:soft, Non-tender, non-distended Extremities:No edema Neurology: Alert, awake.  Marshawn Normoyle Prasad Natacia Chaisson 01/06/2021,9:33 AM  LOS: 1 day

## 2021-01-06 NOTE — Consult Note (Signed)
Chief Complaint: Anemia. Request is for bone marrow biopsy  Referring Physician(s): Josetta Huddle NP   Supervising Physician: Corrie Mckusick  Patient Status: [facility] - In-pt  History of Present Illness: Hunter [name] is a 35 y.o. male History of HTN,  obesity, nephrolithiasis, HLD. Presented to the ED on 9.5.22 with dizziness and near syncopal episode. Found to have anemia and AKI. Team is requesting a bone marrow biopsy for further evaluation of anemia.   Currently without any significant complaints. Patient alert and laying in bed, calm and comfortable. Denies any fevers, headache, chest pain, SOB, cough, abdominal pain, nausea, vomiting or bleeding. Return precautions and treatment recommendations and follow-up discussed with the patient who is agreeable with the plan.    Past Medical History:  Diagnosis Date   Back disorder    L3 spondelothesis   Hypertension    Kidney stones     Past Surgical History:  Procedure Laterality Date   MOLE REMOVAL     ORIF RADIUS & ULNA FRACTURES     rods put in   RHINOPLASTY      Allergies: Patient has no known allergies.  Medications: Prior to Admission medications   Medication Sig Start Date End Date Taking? Authorizing Provider  buPROPion (WELLBUTRIN XL) 300 MG 24 hr tablet TAKE 1 TABLET BY MOUTH EVERY MORNING 05/22/20 05/22/21 Yes   ciprofloxacin (CIPRO) 500 MG tablet Take 1 tablet (500 mg total) by mouth 2 (two) times daily. 01/03/21  Yes Raylene Everts, MD  clotrimazole (LOTRIMIN) 1 % cream Apply topically 2 (two) times daily. 09/20/20  Yes [provider]  fenofibrate 160 MG tablet TAKE 1 TABLET BY MOUTH ONCE DAILY **NEED OFFICE VISIT FOR FURTHER REFILLS** 07/03/20 07/03/21 Yes Ernestine Conrad, MD  gabapentin (NEURONTIN) 300 MG capsule TAKE 1 CAPSULE BY MOUTH THREE TIMES DAILY Patient taking differently: Take 300-600 mg by mouth See admin instructions. Take one tablet by mouth in the morning, then take 2 tablets by mouth at  night per patient 03/11/20 03/11/21 Yes Shelby Dubin, PA-C  hydrochlorothiazide (HYDRODIURIL) 25 MG tablet Take one tablet (25 mg dose) by mouth in the morning. 08/05/20  Yes   HYDROcodone-acetaminophen (NORCO/VICODIN) 5-325 MG tablet Take one tablet by mouth at bedtime as needed for severe pain only for up to 30 days. Patient taking differently: Take 1 tablet by mouth every 6 (six) hours as needed (Back pain). 12/03/20  Yes   lidocaine (LIDODERM) 5 % Wear patch for up to 12 hours then remove and discard patch or as directed by MD Patient taking differently: Place 1 patch onto the skin daily as needed (For back pain). 12/03/20  Yes   lisinopril (PRINIVIL,ZESTRIL) 40 MG tablet Take 40 mg by mouth daily.     Yes [provider]  methylphenidate (CONCERTA) 36 MG PO CR tablet Take 1 tablet by mouth every morning 10/27/20  Yes   methylphenidate (RITALIN) 5 MG tablet Take 1 tablet by mouth every evening as needed for extended focus 12/27/20  Yes   metroNIDAZOLE (FLAGYL) 500 MG tablet Take 1 tablet (500 mg total) by mouth 2 (two) times daily. 01/03/21  Yes Raylene Everts, MD  omega-3 acid ethyl esters (LOVAZA) 1 g capsule Take 1 g by mouth at bedtime.   Yes [provider]  tiZANidine (ZANAFLEX) 4 MG tablet TAKE 1 TABLET BY MOUTH EVERY 8 HOURS AS NEEDED Patient taking differently: Take 2 mg by mouth in the morning and at bedtime. 06/06/20 06/06/21 Yes Shelby Dubin,  PA-C  traMADol (ULTRAM) 50 MG tablet Take 1 to 2 tablets by mouth 3 times daily as needed for severe pain Patient taking differently: Take 100 mg by mouth 2 (two) times daily. 09/03/20  Yes   buPROPion (WELLBUTRIN XL) 300 MG 24 hr tablet TAKE 1 TABLET BY MOUTH EVERY MORNING Patient not taking: No sig reported 05/02/20 05/02/21    buPROPion (WELLBUTRIN XL) 300 MG 24 hr tablet Take one tablet by mouth once daily in the morning. Patient not taking: No sig reported 08/12/20     buPROPion (WELLBUTRIN XL) 300 MG 24 hr tablet Take 1  tablet by mouth every morning Patient not taking: No sig reported 10/27/20     fenofibrate 160 MG tablet Take one tablet (160 mg dose) by mouth daily. Patient not taking: No sig reported 08/12/20     gabapentin (NEURONTIN) 300 MG capsule Take 1 capsule (300 mg dose) by mouth 3 (three) times a day. Patient not taking: No sig reported 09/03/20     gabapentin (NEURONTIN) 300 MG capsule Take one capsule (300 mg dose) by mouth 3 (three) times a day. Patient not taking: No sig reported 12/03/20     lisinopril (ZESTRIL) 40 MG tablet TAKE 1 TABLET BY MOUTH ONCE DAILY Patient not taking: No sig reported 01/15/20 01/14/21  Ernestine Conrad, MD  lisinopril (ZESTRIL) 40 MG tablet Take one tablet (40 mg dose) by mouth daily. Patient not taking: No sig reported 12/12/20     methylphenidate (CONCERTA) 36 MG PO CR tablet Take 1 tablet by mouth every morning Patient not taking: No sig reported 11/27/20     methylphenidate (CONCERTA) 36 MG PO CR tablet Take 1 tablet by mouth every morning Patient not taking: No sig reported 12/27/20     methylphenidate (RITALIN) 5 MG tablet TAKE 1 TABLET BY MOUTH EVERY EVENING AS NEEDED FOR EXTENDED FOCUS 05/23/20 11/19/20  Eliseo Gum, MD  methylphenidate (RITALIN) 5 MG tablet TAKE 1 TABLET BY MOUTH EVERY EVENING AS NEEDED FOR EXTENDED FOCUS **DO NOT FILL SOONER THAN 60 DAYS FROM 05/23/20** 05/23/20 11/19/20  Eliseo Gum, MD  methylphenidate (RITALIN) 5 MG tablet TAKE 1 TABLET BY MOUTH EVERY EVENING AS NEEDED FOR EXTENDED FOCUS 05/23/20 11/19/20  Eliseo Gum, MD  methylphenidate 36 MG PO CR tablet TAKE 1 TABLET BY MOUTH EVERY MORNING 05/23/20 11/19/20  Eliseo Gum, MD  methylphenidate 36 MG PO CR tablet TAKE 1 TABLET BY MOUTH EVERY MORNING 05/23/20 11/19/20  Eliseo Gum, MD  methylphenidate 36 MG PO CR tablet TAKE 1 TABLET BY MOUTH EVERY MORNING **FILL 60 DAYS AFTER 05/23/20** 05/23/20 11/19/20  Eliseo Gum, MD  methylphenidate 36 MG PO CR tablet  TAKE 1 TABLET BY MOUTH EVERY MORNING 02/29/20 08/27/20  Eliseo Gum, MD  methylphenidate 36 MG PO CR tablet TAKE 1 TABLET BY MOUTH EVERY MORNING 02/29/20 08/27/20  Eliseo Gum, MD  methylphenidate 36 MG PO CR tablet TAKE 1 TABLET BY MOUTH EVERY MORNING 02/29/20 08/27/20  Eliseo Gum, MD  methylphenidate 36 MG PO CR tablet Take one tablet by mouth daily. Patient not taking: No sig reported 08/12/20     methylphenidate 36 MG PO CR tablet Take one tablet by mouth daily. Patient not taking: No sig reported 08/12/20     methylphenidate 36 MG PO CR tablet Take one tablet by mouth every morning. Patient not taking: No sig reported 08/12/20     tiZANidine (ZANAFLEX) 4 MG tablet TAKE 1 TABLET BY MOUTH EVERY  8 HOURS AS NEEDED Patient not taking: No sig reported 03/11/20 03/11/21  Shelby Dubin, PA-C  tiZANidine (ZANAFLEX) 4 MG tablet Take one tablet (4 mg dose) by mouth every 8 (eight) hours as needed. Patient not taking: No sig reported 09/03/20     tiZANidine (ZANAFLEX) 4 MG tablet Take one tablet (4 mg dose) by mouth every 8 (eight) hours as needed. Patient not taking: No sig reported 12/03/20     traMADol (ULTRAM) 50 MG tablet Take 1 to 2 tablets by mouth (50-100 mg dose) by mouth up to 3 (three) times a day as needed for severe pain. Patient not taking: No sig reported 12/03/20        Family History  Problem Relation Age of Onset   Hypertension Mother    Cancer Mother        cancer    Social History   Socioeconomic History   Marital status: Single    Spouse name: Not on file   Number of children: Not on file   Years of education: Not on file   Highest education level: Not on file  Occupational History   Not on file  Tobacco Use   Smoking status: Never   Smokeless tobacco: Never  Vaping Use   Vaping Use: Never used  Substance and Sexual Activity   Alcohol use: Yes   Drug use: No   Sexual activity: Yes  Other Topics Concern   Not on file  Social History  Narrative   Not on file   Social Determinants of Health   Financial Resource Strain: Not on file  Food Insecurity: Not on file  Transportation Needs: Not on file  Physical Activity: Not on file  Stress: Not on file  Social Connections: Not on file    Review of Systems: A 12 point ROS discussed and pertinent positives are indicated in the HPI above.  All other systems are negative.  Review of Systems  Constitutional:  Negative for fever.  HENT:  Negative for congestion.   Respiratory:  Negative for cough and shortness of breath.   Cardiovascular:  Negative for chest pain.  Gastrointestinal:  Negative for abdominal pain.  Neurological:  Negative for headaches.  Psychiatric/Behavioral:  Negative for behavioral problems and confusion.    Vital Signs: BP (!) 73/40 (BP Location: Left Arm)   Pulse 74   Temp 98.4 F (36.9 C) (Oral)   Resp 18   Wt (!) 349 lb 6.4 oz (158.5 kg)   SpO2 95%   BMI 50.13 kg/m   Physical Exam Vitals and nursing note reviewed.  Constitutional:      Appearance: He is well-developed.  HENT:     Head: Normocephalic.  Cardiovascular:     Rate and Rhythm: Normal rate and regular rhythm.     Heart sounds: Normal heart sounds.  Pulmonary:     Effort: Pulmonary effort is normal.     Breath sounds: Normal breath sounds.  Musculoskeletal:        General: Normal range of motion.     Cervical back: Normal range of motion.  Skin:    General: Skin is dry.  Neurological:     Mental Status: He is alert and oriented to person, place, and time.    Imaging: US RENAL  Result Date: 01/05/2021 CLINICAL DATA:  Acute kidney injury. EXAM: RENAL / URINARY TRACT ULTRASOUND COMPLETE COMPARISON:  None. FINDINGS: Right Kidney: Renal measurements: 14.3 x 7.4 x 6.2 cm = volume: 345 mL. Echogenicity within normal  limits. No mass or hydronephrosis visualized. Left Kidney: Renal measurements: 16.0 x 7.5 x 8.0 cm = volume: 501 mL. Echogenicity within normal limits. A 12 mm  shadowing stone present near the lower pole of the left kidney. No obstruction. No other focal lesions. Bladder: Appears normal for degree of bladder distention. Other: None. IMPRESSION: 1. Nonobstructing 12 mm stone at the lower pole of the left kidney. 2. Otherwise normal sonographic appearance of the kidneys bilaterally. Electronically Signed   By: San Morelle M.D.   On: 01/05/2021 11:38    Labs:  CBC: Recent Labs    01/05/21 0851 01/05/21 1045 01/05/21 2307 01/06/21 0627  WBC 6.5  --   --  5.0  HGB 8.0* 7.6* 8.1* 8.8*  HCT 24.7* 24.1* 24.5* 27.1*  PLT 238  --   --  213    COAGS: Recent Labs    01/06/21 0627  INR 1.2  APTT 27    BMP: Recent Labs    01/05/21 0851 01/05/21 2307 01/06/21 0627  NA 138 137 140  K 5.0 4.8 4.8  CL 106 106 106  CO2 20* 21* 25  GLUCOSE 91 105* 91  BUN 103* 83* 81*  CALCIUM 8.7* 8.8* 9.3  CREATININE 5.26* 4.10* 4.72*  GFRNONAA 14* 19* 16*    LIVER FUNCTION TESTS: Recent Labs    01/05/21 1045 01/06/21 0627  BILITOT 0.6 0.6  AST 25 30  ALT 41 45*  ALKPHOS 12* 13*  PROT 6.2* 6.8  ALBUMIN 3.2* 3.6     Assessment and Plan:  35 y.o. male inpatient. History of HTN,  obesity, nephrolithiasis, HLD. Presented to the ED on 9.5.22 with dizziness and near syncopal episode. Found to have anemia and AKI. Team is requesting a bone marrow biopsy for further evaluation of anemia.   Hgb 8.8 s/p post 1 unit of PRBC on 9.5.2.  Hct 27.1, BUN 81, Cr 4.72. alkaline phosphatase 13, ALT 45, GFR 16. NKDA.  IR consulted for possible bone marrow biopsy.  Patient tentatively scheduled for 9.8.22.  Team instructed to: Keep Patient to be NPO after midnight Hold prophylactic anticoagulation 24 hours prior to scheduled procedure.  IR will call patient when ready. If patient is hemodynamically stable enough to be discharged procedure can be performed as outpatient.   Risks and benefits of bone marrow biopsy was discussed with the patient and/or  patient's family including, but not limited to bleeding, infection, damage to adjacent structures or low yield requiring additional tests.  All of the questions were answered and there is agreement to proceed.  Consent signed and in chart.  Thank you for this interesting consult.  I greatly enjoyed meeting Hunter [name] and look forward to participating in their care.  A copy of this report was sent to the requesting provider on this date.  Electronically Signed: Jacqualine Mau, NP 01/06/2021, 2:22 PM   I spent a total of 40 Minutes    in face to face in clinical consultation, greater than 50% of which was counseling/coordinating care for bon marrow biopsy

## 2021-01-06 NOTE — Consult Note (Signed)
NAME:  Hunter [name], MRN:  [id number], DOB:  1986/02/01, LOS: 1 ADMISSION DATE:  01/05/2021, CONSULTATION DATE:  9/6 REFERRING MD:  Marlou Sa, CHIEF COMPLAINT:  hypotension    History of Present Illness:  This is a 35 year old male psych 2nd yr resident. Admitted to [facility] 9/5 w/ cc: feeling lightheaded and dizzy w/ associated diaphoresis and hypotension. He went to stand. Subsequently fell to floor but did not lose LOC.   Recently started on flagyl and cipro for pilonidal abscess. In ER found to be hypotensive and in acute renal failure SBP 60s, BUN/cr 103/5.26. was loaded w/ IVFs, antihypertensives held, and admitted. On 9/6 nephrology was consulted. UA and renal US were unremarkable. FOBT negative (note he did get one unit blood on 9/5 for symptomatic anemia).   Pertinent  Medical History  HTN, HL, Obesity, depression, anxiety, chronic back pain, renal stones.   Significant Hospital Events: Including procedures, antibiotic start and stop dates in addition to other pertinent events   9/5 admitted w/ symptomatic hypotension and new AKI. Treated w/ IVFs, antihypertensives and diuretics held. Admitted 9/6 PCCM consulted for on-going hypotension and high out-put renal failure.   Interim History / Subjective:  Consulted  Objective   Blood pressure (Abnormal) 86/58, pulse 79, temperature 98.2 F (36.8 C), temperature source Oral, resp. rate 17, weight (Abnormal) 158.5 kg, SpO2 99 %.        Intake/Output Summary (Last 24 hours) at 01/06/2021 1612 Last data filed at 01/06/2021 1526 Gross per 24 hour  Intake 3747.51 ml  Output 9000 ml  Net -5252.49 ml   Filed Weights   01/06/21 0439  Weight: (Abnormal) 158.5 kg    Examination: General: no distress laying in bed HENT: MMM, trachea midline Lungs: Clear, no wheezing/rhonci or accessory muscle use Cardiovascular: RRR, ext warm Abdomen: Soft, +BS Extremities: no edema Neuro: moves all 4 ext to command Psych: AOx3  Resolved Hospital  Problem list   N/a  Assessment & Plan:  Shock, syncope- symptomatic.  I think his cuff is accurate.  Most c/w hypovolemic, responds to fluids but he pees out whatever fluids are given.  Acute renal failure- bland sediment, small non-obstructing stone on [place].  Suspicion here is ATN in polyuric phase.  The chicken-egg here is the renal failure and over-reaction to antihypertensives.  Anemia- new onset, no evidence of MAHA, bone marrow biopsy planned  - Try to match outs with LR boluses - f/u cortisol and bone marrow results - peripheral levophed PRN - Check echo - the more I think about this, the more I think we just need to give him time to see how he settles out  Best Practice (right click and "Reselect all SmartList Selections" daily)   Diet/type: Regular consistency (see orders) DVT prophylaxis: SCD GI prophylaxis: N/A Lines: N/A Foley:  N/A Code Status:  full code Last date of multidisciplinary goals of care discussion [X]   Labs   CBC: Recent Labs  Lab 01/05/21 0851 01/05/21 1045 01/05/21 2307 01/06/21 0627 01/06/21 1347  WBC 6.5  --   --  5.0  --   NEUTROABS 4.1  --   --   --   --   HGB 8.0* 7.6* 8.1* 8.8* 8.8*  HCT 24.7* 24.1* 24.5* 27.1* 26.6*  MCV 94.3  --   --  92.8  --   PLT 238  --   --  213  --     Basic Metabolic Panel: Recent Labs  Lab 01/05/21 0851 01/05/21 2307 01/06/21  0627  NA 138 137 140  K 5.0 4.8 4.8  CL 106 106 106  CO2 20* 21* 25  GLUCOSE 91 105* 91  BUN 103* 83* 81*  CREATININE 5.26* 4.10* 4.72*  CALCIUM 8.7* 8.8* 9.3   GFR: Estimated Creatinine Clearance: 34.1 mL/min (A) (by C-G formula based on SCr of 4.72 mg/dL (H)). Recent Labs  Lab 01/05/21 0851 01/06/21 0627  WBC 6.5 5.0  LATICACIDVEN 1.0  --     Liver Function Tests: Recent Labs  Lab 01/05/21 1045 01/06/21 0627  AST 25 30  ALT 41 45*  ALKPHOS 12* 13*  BILITOT 0.6 0.6  PROT 6.2* 6.8  ALBUMIN 3.2* 3.6   No results for input(s): LIPASE, AMYLASE in the last 168  hours. No results for input(s): AMMONIA in the last 168 hours.  ABG No results found for: PHART, PCO2ART, PO2ART, HCO3, TCO2, ACIDBASEDEF, O2SAT   Coagulation Profile: Recent Labs  Lab 01/06/21 0627  INR 1.2    Cardiac Enzymes: No results for input(s): CKTOTAL, CKMB, CKMBINDEX, TROPONINI in the last 168 hours.  HbA1C: No results found for: HGBA1C  CBG: Recent Labs  Lab 01/05/21 0825  GLUCAP 106*    Review of Systems:   Review of Systems  Constitutional: Negative.   HENT: Negative.    Eyes: Negative.   Respiratory: Negative.    Cardiovascular:  Positive for palpitations.  Gastrointestinal: Negative.   Genitourinary: Negative.   Musculoskeletal: Negative.   Skin: Negative.   Neurological:  Positive for dizziness.  Endo/Heme/Allergies: Negative.   Psychiatric/Behavioral: Negative.      Past Medical History:  He,  has a past medical history of Back disorder, Hypertension, and Kidney stones.   Surgical History:   Past Surgical History:  Procedure Laterality Date   MOLE REMOVAL     ORIF RADIUS & ULNA FRACTURES     rods put in   RHINOPLASTY       Social History:   reports that he has never smoked. He has never used smokeless tobacco. He reports current alcohol use. He reports that he does not use drugs.   Family History:  His family history includes Cancer in his mother; Hypertension in his mother.   Allergies No Known Allergies   Home Medications  Prior to Admission medications   Medication Sig Start Date End Date Taking? Authorizing Provider  buPROPion (WELLBUTRIN XL) 300 MG 24 hr tablet TAKE 1 TABLET BY MOUTH EVERY MORNING 05/22/20 05/22/21 Yes   ciprofloxacin (CIPRO) 500 MG tablet Take 1 tablet (500 mg total) by mouth 2 (two) times daily. 01/03/21  Yes Raylene Everts, MD  clotrimazole (LOTRIMIN) 1 % cream Apply topically 2 (two) times daily. 09/20/20  Yes [provider]  fenofibrate 160 MG tablet TAKE 1 TABLET BY MOUTH ONCE DAILY **NEED  OFFICE VISIT FOR FURTHER REFILLS** 07/03/20 07/03/21 Yes Ernestine Conrad, MD  gabapentin (NEURONTIN) 300 MG capsule TAKE 1 CAPSULE BY MOUTH THREE TIMES DAILY Patient taking differently: Take 300-600 mg by mouth See admin instructions. Take one tablet by mouth in the morning, then take 2 tablets by mouth at night per patient 03/11/20 03/11/21 Yes Shelby Dubin, PA-C  hydrochlorothiazide (HYDRODIURIL) 25 MG tablet Take one tablet (25 mg dose) by mouth in the morning. 08/05/20  Yes   HYDROcodone-acetaminophen (NORCO/VICODIN) 5-325 MG tablet Take one tablet by mouth at bedtime as needed for severe pain only for up to 30 days. Patient taking differently: Take 1 tablet by mouth every 6 (six) hours as needed (  Back pain). 12/03/20  Yes   lidocaine (LIDODERM) 5 % Wear patch for up to 12 hours then remove and discard patch or as directed by MD Patient taking differently: Place 1 patch onto the skin daily as needed (For back pain). 12/03/20  Yes   lisinopril (PRINIVIL,ZESTRIL) 40 MG tablet Take 40 mg by mouth daily.     Yes [provider]  methylphenidate (CONCERTA) 36 MG PO CR tablet Take 1 tablet by mouth every morning 10/27/20  Yes   methylphenidate (RITALIN) 5 MG tablet Take 1 tablet by mouth every evening as needed for extended focus 12/27/20  Yes   metroNIDAZOLE (FLAGYL) 500 MG tablet Take 1 tablet (500 mg total) by mouth 2 (two) times daily. 01/03/21  Yes Raylene Everts, MD  omega-3 acid ethyl esters (LOVAZA) 1 g capsule Take 1 g by mouth at bedtime.   Yes [provider]  tiZANidine (ZANAFLEX) 4 MG tablet TAKE 1 TABLET BY MOUTH EVERY 8 HOURS AS NEEDED Patient taking differently: Take 2 mg by mouth in the morning and at bedtime. 06/06/20 06/06/21 Yes Reynolds, Sarah B, PA-C  traMADol (ULTRAM) 50 MG tablet Take 1 to 2 tablets by mouth 3 times daily as needed for severe pain Patient taking differently: Take 100 mg by mouth 2 (two) times daily. 09/03/20  Yes   buPROPion (WELLBUTRIN XL) 300 MG 24 hr  tablet TAKE 1 TABLET BY MOUTH EVERY MORNING Patient not taking: No sig reported 05/02/20 05/02/21    buPROPion (WELLBUTRIN XL) 300 MG 24 hr tablet Take one tablet by mouth once daily in the morning. Patient not taking: No sig reported 08/12/20     buPROPion (WELLBUTRIN XL) 300 MG 24 hr tablet Take 1 tablet by mouth every morning Patient not taking: No sig reported 10/27/20     fenofibrate 160 MG tablet Take one tablet (160 mg dose) by mouth daily. Patient not taking: No sig reported 08/12/20     gabapentin (NEURONTIN) 300 MG capsule Take 1 capsule (300 mg dose) by mouth 3 (three) times a day. Patient not taking: No sig reported 09/03/20     gabapentin (NEURONTIN) 300 MG capsule Take one capsule (300 mg dose) by mouth 3 (three) times a day. Patient not taking: No sig reported 12/03/20     lisinopril (ZESTRIL) 40 MG tablet TAKE 1 TABLET BY MOUTH ONCE DAILY Patient not taking: No sig reported 01/15/20 01/14/21  Ernestine Conrad, MD  lisinopril (ZESTRIL) 40 MG tablet Take one tablet (40 mg dose) by mouth daily. Patient not taking: No sig reported 12/12/20     methylphenidate (CONCERTA) 36 MG PO CR tablet Take 1 tablet by mouth every morning Patient not taking: No sig reported 11/27/20     methylphenidate (CONCERTA) 36 MG PO CR tablet Take 1 tablet by mouth every morning Patient not taking: No sig reported 12/27/20     methylphenidate (RITALIN) 5 MG tablet TAKE 1 TABLET BY MOUTH EVERY EVENING AS NEEDED FOR EXTENDED FOCUS 05/23/20 11/19/20  Eliseo Gum, MD  methylphenidate (RITALIN) 5 MG tablet TAKE 1 TABLET BY MOUTH EVERY EVENING AS NEEDED FOR EXTENDED FOCUS **DO NOT FILL SOONER THAN 60 DAYS FROM 05/23/20** 05/23/20 11/19/20  Eliseo Gum, MD  methylphenidate (RITALIN) 5 MG tablet TAKE 1 TABLET BY MOUTH EVERY EVENING AS NEEDED FOR EXTENDED FOCUS 05/23/20 11/19/20  Eliseo Gum, MD  methylphenidate 36 MG PO CR tablet TAKE 1 TABLET BY MOUTH EVERY MORNING 05/23/20 11/19/20  Eliseo Gum, MD  methylphenidate 36 MG PO CR tablet TAKE 1 TABLET BY MOUTH EVERY MORNING 05/23/20 11/19/20  Raelyn Number B, MD  methylphenidate 36 MG PO CR tablet TAKE 1 TABLET BY MOUTH EVERY MORNING **FILL 60 DAYS AFTER 05/23/20** 05/23/20 11/19/20  Eliseo Gum, MD  methylphenidate 36 MG PO CR tablet TAKE 1 TABLET BY MOUTH EVERY MORNING 02/29/20 08/27/20  Eliseo Gum, MD  methylphenidate 36 MG PO CR tablet TAKE 1 TABLET BY MOUTH EVERY MORNING 02/29/20 08/27/20  Eliseo Gum, MD  methylphenidate 36 MG PO CR tablet TAKE 1 TABLET BY MOUTH EVERY MORNING 02/29/20 08/27/20  Eliseo Gum, MD  methylphenidate 36 MG PO CR tablet Take one tablet by mouth daily. Patient not taking: No sig reported 08/12/20     methylphenidate 36 MG PO CR tablet Take one tablet by mouth daily. Patient not taking: No sig reported 08/12/20     methylphenidate 36 MG PO CR tablet Take one tablet by mouth every morning. Patient not taking: No sig reported 08/12/20     tiZANidine (ZANAFLEX) 4 MG tablet TAKE 1 TABLET BY MOUTH EVERY 8 HOURS AS NEEDED Patient not taking: No sig reported 03/11/20 03/11/21  Doy Mince, Cherylann Ratel, PA-C  tiZANidine (ZANAFLEX) 4 MG tablet Take one tablet (4 mg dose) by mouth every 8 (eight) hours as needed. Patient not taking: No sig reported 09/03/20     tiZANidine (ZANAFLEX) 4 MG tablet Take one tablet (4 mg dose) by mouth every 8 (eight) hours as needed. Patient not taking: No sig reported 12/03/20     traMADol (ULTRAM) 50 MG tablet Take 1 to 2 tablets by mouth (50-100 mg dose) by mouth up to 3 (three) times a day as needed for severe pain. Patient not taking: No sig reported 12/03/20

## 2021-01-07 ENCOUNTER — Inpatient Hospital Stay (HOSPITAL_COMMUNITY): Payer: 59

## 2021-01-07 DIAGNOSIS — N179 Acute kidney failure, unspecified: Secondary | ICD-10-CM | POA: Diagnosis not present

## 2021-01-07 DIAGNOSIS — D649 Anemia, unspecified: Secondary | ICD-10-CM | POA: Diagnosis not present

## 2021-01-07 DIAGNOSIS — I428 Other cardiomyopathies: Secondary | ICD-10-CM | POA: Diagnosis not present

## 2021-01-07 LAB — CBC WITH DIFFERENTIAL/PLATELET
Abs Immature Granulocytes: 0.02 10*3/uL (ref 0.00–0.07)
Abs Immature Granulocytes: 0.03 10*3/uL (ref 0.00–0.07)
Basophils Absolute: 0 10*3/uL (ref 0.0–0.1)
Basophils Absolute: 0 10*3/uL (ref 0.0–0.1)
Basophils Relative: 1 %
Basophils Relative: 1 %
Eosinophils Absolute: 0.2 10*3/uL (ref 0.0–0.5)
Eosinophils Absolute: 0.2 10*3/uL (ref 0.0–0.5)
Eosinophils Relative: 3 %
Eosinophils Relative: 3 %
HCT: 26.7 % — ABNORMAL LOW (ref 39.0–52.0)
HCT: 30.6 % — ABNORMAL LOW (ref 39.0–52.0)
Hemoglobin: 8.7 g/dL — ABNORMAL LOW (ref 13.0–17.0)
Hemoglobin: 10.2 g/dL — ABNORMAL LOW (ref 13.0–17.0)
Immature Granulocytes: 0 %
Immature Granulocytes: 1 %
Lymphocytes Relative: 30 %
Lymphocytes Relative: 25 %
Lymphs Abs: 1.8 10*3/uL (ref 0.7–4.0)
Lymphs Abs: 1.3 10*3/uL (ref 0.7–4.0)
MCH: 29.9 pg (ref 26.0–34.0)
MCH: 30.4 pg (ref 26.0–34.0)
MCHC: 32.6 g/dL (ref 30.0–36.0)
MCHC: 33.3 g/dL (ref 30.0–36.0)
MCV: 91.8 fL (ref 80.0–100.0)
MCV: 91.3 fL (ref 80.0–100.0)
Monocytes Absolute: 0.6 10*3/uL (ref 0.1–1.0)
Monocytes Absolute: 0.5 10*3/uL (ref 0.1–1.0)
Monocytes Relative: 10 %
Monocytes Relative: 10 %
Neutro Abs: 3.3 10*3/uL (ref 1.7–7.7)
Neutro Abs: 3.1 10*3/uL (ref 1.7–7.7)
Neutrophils Relative %: 56 %
Neutrophils Relative %: 60 %
Platelets: 233 10*3/uL (ref 150–400)
Platelets: 250 10*3/uL (ref 150–400)
RBC: 2.91 MIL/uL — ABNORMAL LOW (ref 4.22–5.81)
RBC: 3.35 MIL/uL — ABNORMAL LOW (ref 4.22–5.81)
RDW: 12.7 % (ref 11.5–15.5)
RDW: 12.6 % (ref 11.5–15.5)
WBC: 5.8 10*3/uL (ref 4.0–10.5)
WBC: 5.2 10*3/uL (ref 4.0–10.5)
nRBC: 0 % (ref 0.0–0.2)
nRBC: 0 % (ref 0.0–0.2)

## 2021-01-07 LAB — PROTEIN ELECTROPHORESIS, SERUM
A/G Ratio: 1.5 (ref 0.7–1.7)
Albumin ELP: 3.4 g/dL (ref 2.9–4.4)
Alpha-1-Globulin: 0.2 g/dL (ref 0.0–0.4)
Alpha-2-Globulin: 0.6 g/dL (ref 0.4–1.0)
Beta Globulin: 0.9 g/dL (ref 0.7–1.3)
Gamma Globulin: 0.5 g/dL (ref 0.4–1.8)
Globulin, Total: 2.3 g/dL (ref 2.2–3.9)
Total Protein ELP: 5.7 g/dL — ABNORMAL LOW (ref 6.0–8.5)

## 2021-01-07 LAB — BASIC METABOLIC PANEL
Anion gap: 11 (ref 5–15)
BUN: 62 mg/dL — ABNORMAL HIGH (ref 6–20)
CO2: 22 mmol/L (ref 22–32)
Calcium: 9.5 mg/dL (ref 8.9–10.3)
Chloride: 105 mmol/L (ref 98–111)
Creatinine, Ser: 2.99 mg/dL — ABNORMAL HIGH (ref 0.61–1.24)
GFR, Estimated: 28 mL/min — ABNORMAL LOW (ref >60)
Glucose, Bld: 89 mg/dL (ref 70–99)
Potassium: 4.5 mmol/L (ref 3.5–5.1)
Sodium: 138 mmol/L (ref 135–145)

## 2021-01-07 LAB — ECHOCARDIOGRAM COMPLETE
AR max vel: 4.94 cm2
AV Area VTI: 4.66 cm2
AV Area mean vel: 4.2 cm2
AV Mean grad: 5 mmHg
AV Peak grad: 8.1 mmHg
Ao pk vel: 1.42 m/s
Area-P 1/2: 3.73 cm2
S' Lateral: 3.2 cm
Single Plane A4C EF: 56.6 %
Weight: 5590.4 oz

## 2021-01-07 LAB — MAGNESIUM: Magnesium: 1.4 mg/dL — ABNORMAL LOW (ref 1.7–2.4)

## 2021-01-07 LAB — KAPPA/LAMBDA LIGHT CHAINS
Kappa free light chain: 42.1 mg/L — ABNORMAL HIGH (ref 3.3–19.4)
Kappa, lambda light chain ratio: 1.98 — ABNORMAL HIGH (ref 0.26–1.65)
Lambda free light chains: 21.3 mg/L (ref 5.7–26.3)

## 2021-01-07 LAB — CORTISOL-AM, BLOOD: Cortisol - AM: 13.9 ug/dL (ref 6.7–22.6)

## 2021-01-07 LAB — MRSA NEXT GEN BY PCR, NASAL: MRSA by PCR Next Gen: NOT DETECTED

## 2021-01-07 LAB — PATHOLOGIST SMEAR REVIEW

## 2021-01-07 LAB — ERYTHROPOIETIN: Erythropoietin: 11.6 m[IU]/mL (ref 2.6–18.5)

## 2021-01-07 MED ORDER — MAGNESIUM SULFATE 2 GM/50ML IV SOLN
2.0000 g | Freq: Once | INTRAVENOUS | Status: AC
  Administered 2021-01-07: 2 g via INTRAVENOUS
  Filled 2021-01-07: qty 50

## 2021-01-07 MED ORDER — LACTATED RINGERS IV BOLUS
1000.0000 mL | Freq: Once | INTRAVENOUS | Status: AC
  Administered 2021-01-07: 1000 mL via INTRAVENOUS

## 2021-01-07 MED ORDER — LACTATED RINGERS IV SOLN: INTRAVENOUS | Status: AC

## 2021-01-07 NOTE — Consult Note (Signed)
   NAME:  Hunter [name], MRN:  [id number], DOB:  [birth year], LOS: 2 ADMISSION DATE:  01/05/2021, CONSULTATION DATE:  9/6 REFERRING MD:  Marlou Sa, CHIEF COMPLAINT:  hypotension    History of Present Illness:  This is a 35 year old male psych 2nd yr resident. [facility]mitted [facility] w/ cc: feeling lightheaded and dizzy w/ associated diaphoresis and hypotension. He went to stand. Subsequently fell to floor but did not lose LOC.   Recently started on flagyl and cipro for pilonidal abscess. In ER found to be hypotensive and in acute renal failure SBP 60s, BUN/cr 103/5.26. was loaded w/ IVFs, antihypertensives held, and admitted. On 9/6 nephrology was consulted. UA and renal US were unremarkable. FOBT negative (note he did get one unit blood on 9/5 for symptomatic anemia).   Pertinent  Medical History  HTN, HL, Obesity, depression, anxiety, chronic back pain, renal stones.   Significant Hospital Events: Including procedures, antibiotic start and stop dates in addition to other pertinent events   9/5 admitted w/ symptomatic hypotension and new AKI. Treated w/ IVFs, antihypertensives and diuretics held. Admitted 9/6 PCCM consulted for on-going hypotension and high out-put renal failure.   Interim History / Subjective:  Consulted  Objective   Blood pressure (!) 105/39, pulse 79, temperature 98.5 F (36.9 C), temperature source Oral, resp. rate 13, weight (!) 158.5 kg, SpO2 97 %.        Intake/Output Summary (Last 24 hours) at 01/07/2021 0849 Last data filed at 01/07/2021 0600 Gross per 24 hour  Intake 3066.13 ml  Output 4700 ml  Net -1633.87 ml    Filed Weights   01/06/21 0439  Weight: (!) 158.5 kg    Examination: General: no distress laying in bed HENT: MMM, trachea midline Lungs: Clear, no wheezing/rhonci or accessory muscle use Cardiovascular: RRR, ext warm Abdomen: Soft, +BS Extremities: no edema Neuro: moves all 4 ext to command Psych: Aox3 Skin: L gluteal cleft with no signs of  infection, well-healing  Resolved Hospital Problem list   N/a  Assessment & Plan:  Shock, syncope- improved, suspect exaggerated antihypertensive response in setting of AKI; initial cause of AKI unclear ?microstones or obstruction, should continue to improve with time Acute renal failure- improving, initial insult unclear, ongoing insult was hypotension Anemia- new onset, no evidence of MAHA, bone marrow biopsy planned  - Try to match outs with LR boluses: he is 1.6L back I will give him another liter - f/u cortisol  - f/u remaining heme labs, bone marrow results (to be done tomorrow) - f/u echo but suspect low yield - stable for transfer back to floor, okay to receive as much LR as he needs to match UoP - Available PRN  Best Practice (right click and "Reselect all SmartList Selections" daily)   Diet/type: Regular consistency (see orders) DVT prophylaxis: SCD GI prophylaxis: N/A Lines: N/A Foley:  N/A Code Status:  full code Last date of multidisciplinary goals of care discussion $RemoveBeforeD'[X]'[id number]$    Erskine Emery MD PCCM

## 2021-01-07 NOTE — Progress Notes (Signed)
[facility] NEPHROLOGY PROGRESS NOTE  Assessment/ Plan: Pt is a 35 y.o. yo male  with a history of HTN, nephrolithiasis, and chronic back pain who is a current psychiatry resident here at [facility] admitted with dizziness and anemia seen as consultation for evaluation of AKI. On admission, significantly hypotensive 67/37. Home antihypertensives include lisinopril and HCTZ which were held.    # AKI- nonoliguric likely in the setting of hypotension resulting in reduced renal perfusion. UA and renal US unremarkable.  Renal function improving with IVF. Cr down to 2.99 this morning. Continue to trend.   # Symptomatic hypotension- thought to be in the setting of lisinopril and hctz use, which have been discontinued. Transferred to ICU yesterday due to persistent hypotension and high output renal failure. Did not require pressor support and pressures improved with IVF. Plan to transfer out of ICU today.  # Anemia- stable, hgb 10.2, s/p 1 unit PRBC. Myeloma panel pending and plan for BM biopsy tomorrow.  # Chronic back pain- continue to avoid NSAIDs.    Subjective:    Patient reports feeling well this morning with no complaints. Continuing to have good urinary output.   Objective Vital signs in last 24 hours: Vitals:   01/07/21 0530 01/07/21 0600 01/07/21 0630 01/07/21 0726  BP: 119/67 125/90 (!) 105/39   Pulse: 69 97 79   Resp: 13 20 13    Temp:    98.5 F (36.9 C)  TempSrc:    Oral  SpO2: 97% 95% 97%   Weight:       Weight change:   Intake/Output Summary (Last 24 hours) at 01/07/2021 0823 Last data filed at 01/07/2021 0600 Gross per 24 hour  Intake 3066.13 ml  Output 5300 ml  Net -2233.87 ml       Labs: Basic Metabolic Panel: Recent Labs  Lab 01/05/21 0851 01/05/21 2307 01/06/21 0627  NA 138 137 140  K 5.0 4.8 4.8  CL 106 106 106  CO2 20* 21* 25  GLUCOSE 91 105* 91  BUN 103* 83* 81*  CREATININE 5.26* 4.10* 4.72*  CALCIUM 8.7* 8.8* 9.3   Liver  Function Tests: Recent Labs  Lab 01/05/21 1045 01/06/21 0627  AST 25 30  ALT 41 45*  ALKPHOS 12* 13*  BILITOT 0.6 0.6  PROT 6.2* 6.8  ALBUMIN 3.2* 3.6   No results for input(s): LIPASE, AMYLASE in the last 168 hours. No results for input(s): AMMONIA in the last 168 hours. CBC: Recent Labs  Lab 01/05/21 0851 01/05/21 1045 01/06/21 0627 01/06/21 1347 01/07/21 0122 01/07/21 0742  WBC 6.5  --  5.0  --  5.8 5.2  NEUTROABS 4.1  --   --   --  3.3 3.1  HGB 8.0*   < > 8.8* 8.8* 8.7* 10.2*  HCT 24.7*   < > 27.1* 26.6* 26.7* 30.6*  MCV 94.3  --  92.8  --  91.8 91.3  PLT 238  --  213  --  233 250   < > = values in this interval not displayed.   Cardiac Enzymes: No results for input(s): CKTOTAL, CKMB, CKMBINDEX, TROPONINI in the last 168 hours. CBG: Recent Labs  Lab 01/05/21 0825  GLUCAP 106*    Iron Studies:  Recent Labs    01/05/21 1045  IRON 62  TIBC 465*  FERRITIN 497*   Studies/Results: 03/07/21 RENAL  Result Date: 01/05/2021 CLINICAL DATA:  Acute kidney injury. EXAM: RENAL / URINARY TRACT ULTRASOUND COMPLETE COMPARISON:  None. FINDINGS: Right Kidney:  Renal measurements: 14.3 x 7.4 x 6.2 cm = volume: 345 mL. Echogenicity within normal limits. No mass or hydronephrosis visualized. Left Kidney: Renal measurements: 16.0 x 7.5 x 8.0 cm = volume: 501 mL. Echogenicity within normal limits. A 12 mm shadowing stone present near the lower pole of the left kidney. No obstruction. No other focal lesions. Bladder: Appears normal for degree of bladder distention. Other: None. IMPRESSION: 1. Nonobstructing 12 mm stone at the lower pole of the left kidney. 2. Otherwise normal sonographic appearance of the kidneys bilaterally. Electronically Signed   By: Marin Roberts M.D.   On: 01/05/2021 11:38    Medications: Infusions:  sodium chloride     lactated ringers 150 mL/hr at 01/07/21 0600   norepinephrine (LEVOPHED) Adult infusion      Scheduled Medications:  buPROPion  300 mg Oral  q morning   Chlorhexidine Gluconate Cloth  6 each Topical Q0600   gabapentin  300 mg Oral TID   sodium chloride flush  3 mL Intravenous Q12H    have reviewed scheduled and prn medications.  Physical Exam: General:NAD, comfortable Heart:RRR, s1s2 nl Lungs:clear b/l, no crackle Abdomen:soft, Non-tender, non-distended Extremities:No edema Dialysis Access: n/a   Tamar Lipscomb N Deovion Batrez 01/07/2021,8:23 AM  LOS: 2 days

## 2021-01-07 NOTE — Progress Notes (Signed)
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  He has mild "dizziness "when standing.  No bleeding.  No other complaint.  Objective: Vital signs in last 24 hours: Blood pressure (!) 105/39, pulse 79, temperature 98.5 F (36.9 C), temperature source Oral, resp. rate 13, weight (!) 349 lb 6.4 oz (158.5 kg), SpO2 97 %.  Intake/Output from previous day: 09/06 0701 - 09/07 0700 In: 3066.1 [I.V.:2064.5; IV Piggyback:1001.7] Out: 6600 [Urine:6600]  Physical Exam:  HEENT: No thrush  Abdomen: No hepatosplenomegaly  Skin: Mild erythema and induration at the left upper gluteal fold, no fluctuance or tenderness   Lab Results: Recent Labs    01/07/21 0122 01/07/21 0742  WBC 5.8 5.2  HGB 8.7* 10.2*  HCT 26.7* 30.6*  PLT 233 250    BMET Recent Labs    01/05/21 2307 01/06/21 0627  NA 137 140  K 4.8 4.8  CL 106 106  CO2 21* 25  GLUCOSE 105* 91  BUN 83* 81*  CREATININE 4.10* 4.72*  CALCIUM 8.8* 9.3    No results found for: CEA1, CEA, [id number], CA125  Studies/Results: US RENAL  Result Date: 01/05/2021 CLINICAL DATA:  Acute kidney injury. EXAM: RENAL / URINARY TRACT ULTRASOUND COMPLETE COMPARISON:  None. FINDINGS: Right Kidney: Renal measurements: 14.3 x 7.4 x 6.2 cm = volume: 345 mL. Echogenicity within normal limits. No mass or hydronephrosis visualized. Left Kidney: Renal measurements: 16.0 x 7.5 x 8.0 cm = volume: 501 mL. Echogenicity within normal limits. A 12 mm shadowing stone present near the lower pole of the left kidney. No obstruction. No other focal lesions. Bladder: Appears normal for degree of bladder distention. Other: None. IMPRESSION: 1. Nonobstructing 12 mm stone at the lower pole of the left kidney. 2. Otherwise normal sonographic appearance of the kidneys bilaterally. Electronically Signed   By: San Morelle M.D.   On: 01/05/2021 11:38    Medications: I have reviewed the patient's current medications.  Assessment/Plan:  1.  Normocytic anemia 1 unit of packed red blood cells 01/05/2021  2.  ARF 3.  Syncope/hypotension 4.  Pilonidal abscess 5.  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  The hypotension has resolved.  The hemoglobin is higher this morning.  It is possible the anemia is related to iron deficiency, peripheral blood smear findings and serum iron studies are consistent with this.  However the normal MCV, elevated ferritin level, and lack of a source for blood loss argue against iron deficiency. The normal white count and platelets argue against a primary bone marrow process such as aplasia or a hematopoietic malignancy.  The plan is to proceed with a diagnostic bone marrow biopsy tomorrow.  I will run the peripheral blood smear again today.  I ordered a soluble transferrin receptor to further evaluate iron deficiency.  We can consider canceling the bone marrow biopsy if the hemoglobin is improved again tomorrow.  We will follow-up on his renal function from this morning.  A myeloma panel is pending.     LOS: 2 days   Betsy Coder, MD   01/07/2021, 8:18 AM

## 2021-01-07 NOTE — Progress Notes (Signed)
eLink Physician-Brief Progress Note Patient Name: Hunter [name] DOB: 26-Sep-1985 MRN: [id number]   Date of Service  01/07/2021  HPI/Events of Note  Patient's LR gtt order has fallen off the [facility] and bedside RN is requesting that if be reordered, patient is receiving iv fluids to match on going losses as he recovers from acute renal failure.  eICU Interventions  LR gtt ordered to run at 100 ml / hour x 12 hours then review by attending MD.        Migdalia Dk 01/07/2021, 9:47 PM

## 2021-01-08 ENCOUNTER — Inpatient Hospital Stay (HOSPITAL_COMMUNITY): Payer: 59

## 2021-01-08 DIAGNOSIS — N179 Acute kidney failure, unspecified: Secondary | ICD-10-CM | POA: Diagnosis not present

## 2021-01-08 DIAGNOSIS — D649 Anemia, unspecified: Secondary | ICD-10-CM | POA: Diagnosis not present

## 2021-01-08 DIAGNOSIS — I950 Idiopathic hypotension: Secondary | ICD-10-CM

## 2021-01-08 LAB — CBC WITH DIFFERENTIAL/PLATELET
Abs Immature Granulocytes: 0.04 10*3/uL (ref 0.00–0.07)
Basophils Absolute: 0 10*3/uL (ref 0.0–0.1)
Basophils Relative: 1 %
Eosinophils Absolute: 0.2 10*3/uL (ref 0.0–0.5)
Eosinophils Relative: 3 %
HCT: 28 % — ABNORMAL LOW (ref 39.0–52.0)
Hemoglobin: 9.7 g/dL — ABNORMAL LOW (ref 13.0–17.0)
Immature Granulocytes: 1 %
Lymphocytes Relative: 30 %
Lymphs Abs: 1.8 10*3/uL (ref 0.7–4.0)
MCH: 31.2 pg (ref 26.0–34.0)
MCHC: 34.6 g/dL (ref 30.0–36.0)
MCV: 90 fL (ref 80.0–100.0)
Monocytes Absolute: 0.6 10*3/uL (ref 0.1–1.0)
Monocytes Relative: 10 %
Neutro Abs: 3.4 10*3/uL (ref 1.7–7.7)
Neutrophils Relative %: 55 %
Platelets: 245 10*3/uL (ref 150–400)
RBC: 3.11 MIL/uL — ABNORMAL LOW (ref 4.22–5.81)
RDW: 12.3 % (ref 11.5–15.5)
WBC: 6.1 10*3/uL (ref 4.0–10.5)
nRBC: 0 % (ref 0.0–0.2)

## 2021-01-08 LAB — RETICULOCYTES
Immature Retic Fract: 10.4 % (ref 2.3–15.9)
RBC.: 3.07 MIL/uL — ABNORMAL LOW (ref 4.22–5.81)
Retic Count, Absolute: 81 10*3/uL (ref 19.0–186.0)
Retic Ct Pct: 2.6 % (ref 0.4–3.1)

## 2021-01-08 LAB — SOLUBLE TRANSFERRIN RECEPTOR: Transferrin Receptor: 12.7 nmol/L (ref 12.2–27.3)

## 2021-01-08 LAB — ANCA TITERS
Atypical P-ANCA titer: <1:20 {titer}
C-ANCA: <1:20 {titer}
P-ANCA: <1:20 {titer}

## 2021-01-08 LAB — ANA W/REFLEX IF POSITIVE: Anti Nuclear Antibody (ANA): NEGATIVE

## 2021-01-08 MED ORDER — MIDAZOLAM HCL 2 MG/2ML IJ SOLN
INTRAMUSCULAR | Status: DC | PRN
  Administered 2021-01-08: 1 mg via INTRAVENOUS

## 2021-01-08 MED ORDER — BUPROPION HCL ER (XL) 300 MG PO TB24: ORAL_TABLET | Freq: Every morning | ORAL | 1 refills | Status: DC

## 2021-01-08 MED ORDER — FENTANYL CITRATE (PF) 100 MCG/2ML IJ SOLN
INTRAMUSCULAR | Status: AC
  Filled 2021-01-08: qty 2

## 2021-01-08 MED ORDER — METHYLPHENIDATE HCL 5 MG PO TABS: ORAL_TABLET | ORAL | 0 refills | Status: DC

## 2021-01-08 MED ORDER — FENTANYL CITRATE (PF) 100 MCG/2ML IJ SOLN
INTRAMUSCULAR | Status: DC | PRN
  Administered 2021-01-08: 50 ug via INTRAVENOUS

## 2021-01-08 MED ORDER — OMEGA-3-ACID ETHYL ESTERS 1 G PO CAPS: 1.0000 g | ORAL_CAPSULE | Freq: Every day | ORAL | Status: AC

## 2021-01-08 MED ORDER — MIDAZOLAM HCL 2 MG/2ML IJ SOLN
INTRAMUSCULAR | Status: AC
  Filled 2021-01-08: qty 4

## 2021-01-08 MED ORDER — GABAPENTIN 300 MG PO CAPS: 300.0000 mg | ORAL_CAPSULE | ORAL | Status: DC

## 2021-01-08 MED ORDER — FENOFIBRATE 160 MG PO TABS: ORAL_TABLET | ORAL | 0 refills | Status: DC

## 2021-01-08 MED ORDER — TIZANIDINE HCL 4 MG PO TABS: 2.0000 mg | ORAL_TABLET | Freq: Two times a day (BID) | ORAL | Status: AC

## 2021-01-08 NOTE — Progress Notes (Signed)
Patient has no questions after I have reviewed the AVS with him. PIVS removed and wheeling patient down to his car to drive home. Tamsen Meek, RN 01/08/2021 1:44 PM

## 2021-01-08 NOTE — Consult Note (Signed)
   [facility] CM Inpatient Consult   01/08/2021  [name] 05-31-1985 [id number]   [facility] [[facility]]  Accountable Care Organization [ACO] Patient: Hunter [name]  Patient has been assigned to a [facility] Care Coordinator for the [facility] [name].   Our community based [name] of care has focused on disease management and community resource support.     Patient will receive a post hospital call and will be evaluated for assessments and disease process education.       [name]: Patient will be followed by [facility] RN Care Coordinator.   For additional questions or referrals please contact:   Charlesetta Shanks, RN BSN CCM [facility]  [phone] business mobile phone Toll free office [phone]  Fax number: [phone] [name]

## 2021-01-08 NOTE — Procedures (Signed)
Interventional Radiology Procedure Note  Procedure: CT guided aspirate and core biopsy of left posterior iliac bone  Complications: None  Recommendations: - Bedrest supine x 1 hrs - OTC's PRN  Pain - Follow biopsy results - OK with diet, per primary order  Signed,  Yvone Neu. Loreta Ave, DO

## 2021-01-08 NOTE — Discharge Summary (Signed)
Name: Hunter [name] MRN: [id number] DOB: May 03, 1986 35 y.o. PCP: Ernestine Conrad, MD  Date of Admission: 01/05/2021  8:19 AM Date of Discharge:  01/08/2021 Attending Physician: Dr. Philipp Ovens  DISCHARGE DIAGNOSIS:  Primary Problem: Acute renal failure (ARF) [facility])   Hospital Problems: Principal Problem:   Acute renal failure (ARF) ([place]) Active Problems:   Benign essential hypertension   OSA on CPAP   Acute anemia   Hypotension    DISCHARGE MEDICATIONS:   Allergies as of 01/08/2021   No Known Allergies      Medication List     STOP taking these medications    ciprofloxacin 500 MG tablet Commonly known as: CIPRO   clotrimazole 1 % cream Commonly known as: LOTRIMIN   hydrochlorothiazide 25 MG tablet Commonly known as: HYDRODIURIL   lidocaine 5 % Commonly known as: LIDODERM   lisinopril 40 MG tablet Commonly known as: ZESTRIL   metroNIDAZOLE 500 MG tablet Commonly known as: FLAGYL       TAKE these medications    buPROPion 300 MG 24 hr tablet Commonly known as: WELLBUTRIN XL TAKE 1 TABLET BY MOUTH EVERY MORNING What changed: Another medication with the same name was removed. Continue taking this medication, and follow the directions you see here.   fenofibrate 160 MG tablet TAKE 1 TABLET BY MOUTH ONCE DAILY **NEED OFFICE VISIT FOR FURTHER REFILLS** What changed: Another medication with the same name was removed. Continue taking this medication, and follow the directions you see here.   gabapentin 300 MG capsule Commonly known as: NEURONTIN Take 1-2 capsules (300-600 mg total) by mouth See admin instructions. Take one tablet by mouth in the morning, then take 2 tablets by mouth at night per patient What changed:  how much to take when to take this additional instructions Another medication with the same name was removed. Continue taking this medication, and follow the directions you see here.   HYDROcodone-acetaminophen 5-325 MG tablet Commonly  known as: NORCO/VICODIN Take one tablet by mouth at bedtime as needed for severe pain only for up to 30 days. What changed:  how much to take how to take this when to take this reasons to take this   methylphenidate 5 MG tablet Commonly known as: Ritalin Take 1 tablet by mouth every evening as needed for extended focus What changed: Another medication with the same name was removed. Continue taking this medication, and follow the directions you see here.   omega-3 acid ethyl esters 1 g capsule Commonly known as: LOVAZA Take 1 capsule (1 g total) by mouth at bedtime.   tiZANidine 4 MG tablet Commonly known as: ZANAFLEX Take 0.5 tablets (2 mg total) by mouth in the morning and at bedtime. What changed:  how much to take when to take this reasons to take this Another medication with the same name was removed. Continue taking this medication, and follow the directions you see here.   traMADol 50 MG tablet Commonly known as: ULTRAM Take 1 to 2 tablets by mouth 3 times daily as needed for severe pain What changed:  how much to take how to take this when to take this Another medication with the same name was removed. Continue taking this medication, and follow the directions you see here.        DISPOSITION AND FOLLOW-UP:  Mr.Hunter [name] was discharged from [facility] in Stable condition. At the hospital follow up visit please address:  Acute renal failure Daily labs show continued improvement of acute  renal failure with most recent BUN/Cr of 62/2.99. F/u BMP for continued resolution of acute renal failure.  Hypotension Patient had been on steady doses of lisinopril and HCTZ prior to admission. Given persistent hypotension throughout stay these were held. He as been advised to continue holding these medications and to check his BP at home daily. If he notices that the readings are consistently elevated, he has been instructed to resume  lisinopril.  Acute anemia Stable on discharge with Hgb of 9.7. F/u bone marrow biopsy and repeat CBC.  Pilonidal cyst Patient received 3 doses of antibiotics prior to admission. It remains unchanged with some reported tenderness due to sitting on the area. Ensure continued improvement of cyst.  Follow-up Recommendations: Consults: Hematology/oncology  Labs: Basic Metabolic Profile and CBC Studies: None Medications: Discontinue lisinopril and HCTZ as well as ciprofloxacin and metronidazole.  Follow-up Appointments: Follow-up with your primary care physician on Tuesday, September 13 at 9:20 a.m.  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  #Syncope #Hypotension Patient presented with syncope in setting of hypotension. He was in his usual state of health until the morning of 9/5, when he became dizzy when getting out of his car. He subsequently developed palpitations and diaphoresis and on standing, had a syncopal episode. He denies losing consciousness but did note bilateral upper extremity tremors during this episode. No head trauma. Noted to have systolic BP 95-09'T on arrival. He was started on fluid resuscitation (2L LR bolus + LR 250cc/hr x24hrs) with improvement to SBP 80-90's. He was mentating well. Physical exam is otherwise benign. No lactic acidosis. No recent fevers/chills, decreased oral intake, headaches. Did have acute drop in hemoglobin, but denied any GI symptoms. No recent abdominal trauma. Troponins were negative. On 9/5, his hemoglobin was 8 s/p 1 unit pRBCs. On 9/6, his blood pressure decreased again to 73/40 at 1203. Orthostatics were being completed shortly after, but the patient felt too dizzy to stand. Lying BP was 81/38  and sitting was 63/44. Cuff was checked for accuracy. LR was restarted, and nephrology decreased the rate from 264m/hr to 740mhr. He was transferred to the ICU and did not require pressure support. Pressures improved with IVF, and on 9/7, the hypotension resolved.    #Acute renal failure Patient noted to have acute elevation in BUN/sCr 103/5.26 (baseline ~0.7). He does have a remote history of renal stones; however, none recent. He does note prior history of AKI with similar elevation in sCr in setting of dehydration with GI illness. Renal [place] nonobstructing renal stone. Urinalysis without hemoglobinuria, leukocytes, proteinuria, or ketonuria. FeNa 3.5% consistent with intrinsic etiology possible ATN in setting of hypotension vs glomerulonephritis. ESR, CRP, ANCA, AMA, C3/C4 complement, SPEP/UPUP/IFE/Light Chains were ordered. CRP was elevated to 1.4, and ESR was elevated to 20, but studies were otherwise normal. Nephrology was consulted. Mild elevation of kappa free light chains likely related to renal insufficiency. Patient received IV fluids to match ongoing losses as he was recovering from acute renal failure.   #Symptomatic Anemia Patient noted to have acute anemia with Hb 8.0 on arrival (14.5 in October 2021). He denied any changes in bowel habits including diarrhea, dark tarry stools, or hematochezia or nausea. He denied any abdominal trauma. Iron studies with decreased iron saturation but elevated ferritin, more consistent with  anemia of chronic disease. Absolute retic count 1.9 and Retic index 0.97, consistent with hypoproliferative etiology. Initial concern for hemolysis; however, T.biliribin and LDH wnl. Given that he was symptomatic, he was transfused 1u pRBC on 9/5. Mild improvement in his hemoglobin to 8.8, HCT to 27.1, and RBC to 2.92 on 9/6. Medical oncology was consulted. FOBT was negative. Patient continued feeling well. No complaints of bleeding. IR was consulted for possible bone  marrow biopsy. Blood smear showed a few ovalocytes and teardrops but was otherwise unremarkable. Medical oncology told the patient the bone marrow biopsy could be cancelled, but the patient wanted to proceed. Mild elevation of kappa free light chains likely related to renal insufficiency, and myeloma panel pending. Bone marrow biopsy performed.  #Pilonidal abscess Patient has a pilonidal abscess. On 9/3, he began a 7 day course of ciprofloxacin and flagyl, but antibiotics were discontinued.  #Hx of hypertension Patient takes lisinopril and HCTZ at baseline. Held in the setting of hypotension.   #Chronic back pain Patient has L3 spondelolisthesis. Patient is on gabapentin and tramadol at baseline to manage the pain. Pain regimen has been continued during the hospitalization.   DISCHARGE INSTRUCTIONS:   Discharge Instructions     Call MD for:  persistant dizziness or light-headedness   Complete by: As directed    Call MD for:  redness, tenderness, or signs of infection (pain, swelling, redness, odor or green/yellow discharge around incision site)   Complete by: As directed    Call MD for:  severe uncontrolled pain   Complete by: As directed    Call MD for:  temperature >100.4   Complete by: As directed    Diet - low sodium heart healthy   Complete by: As directed    Increase activity slowly   Complete by: As directed    Remove dressing in 24 hours   Complete by: As directed        SUBJECTIVE:  Patient examined at bedside on day of discharge. He reports tenderness from bone marrow biopsy site but otherwise has no concerns or complaints.  Discharge Vitals:   BP (!) 144/90   Pulse 93   Temp 98.4 F (36.9 C) (Oral)   Resp 16   Wt (!) 158.3 kg   SpO2 93%   BMI 50.07 kg/m   OBJECTIVE:  Constitutional: Patient is sitting upright in bed. In no acute distress. Cardio: Regular rate and rhythm. No murmurs, rubs, gallops. Pulm: Normal effort of breathing. Clear to auscultation  bilaterally. Abdomen: Soft, non-distended, non-tender. MSK: Normal bulk and tone. Normal strength in all 4 extremities. Neuro: No focal deficit. Alert and oriented x 3. Psych: Appropriate mood and affect.   Pertinent Labs, Studies, and Procedures:  CBC Latest Ref Rng & Units 01/08/2021 01/07/2021 01/07/2021  WBC 4.0 - 10.5 K/uL 6.1 5.2 5.8  Hemoglobin 13.0 - 17.0 g/dL 9.7(L) 10.2(L) 8.7(L)  Hematocrit 39.0 - 52.0 % 28.0(L) 30.6(L) 26.7(L)  Platelets 150 - 400 K/uL 245 250 233    CMP Latest Ref Rng & Units 01/07/2021 01/06/2021 01/05/2021  Glucose 70 - 99 mg/dL 89 91 105(H)  BUN 6 - 20 mg/dL 62(H) 81(H) 83(H)  Creatinine 0.61 - 1.24 mg/dL 2.99(H) 4.72(H) 4.10(H)  Sodium 135 - 145 mmol/L 138 140 137  Potassium 3.5 - 5.1 mmol/L 4.5 4.8 4.8  Chloride 98 - 111 mmol/L 105 106 106  CO2 22 - 32 mmol/L 22 25 21(L)  Calcium 8.9 - 10.3 mg/dL 9.5 9.3 8.8(L)  Total Protein 6.5 -  8.1 g/dL - 6.8 -  Total Bilirubin 0.3 - 1.2 mg/dL - 0.6 -  Alkaline Phos 38 - 126 U/L - 13(L) -  AST 15 - 41 U/L - 30 -  ALT 0 - 44 U/L - 45(H) -   AM Cortisol: 13.9  Kappa free light chains: 42.1 Lambda free light chains: 21.3 Kappa, lambda light chain ratio: 1.98  FOBT: Negative Urinalysis: Unremarkable. Una 94, Ucr 103.37.  Protein electrophoresis, serum: Total protein ELP 5.7 Albumin ELP 3.4 Alpha-1-globulin 0.2 Alpha-2-globulin 0.6 Beta globulin 0.9 Gamma globulin 0.5 M-Spike Not observed  US RENAL Result Date: 01/05/2021 CLINICAL DATA:  Acute kidney injury. EXAM: RENAL / URINARY TRACT ULTRASOUND COMPLETE COMPARISON:  None. FINDINGS: Right Kidney: Renal measurements: 14.3 x 7.4 x 6.2 cm = volume: 345 mL. Echogenicity within normal limits. No mass or hydronephrosis visualized. Left Kidney: Renal measurements: 16.0 x 7.5 x 8.0 cm = volume: 501 mL. Echogenicity within normal limits. A 12 mm shadowing stone present near the lower pole of the left kidney. No obstruction. No other focal lesions. Bladder: Appears normal  for degree of bladder distention. Other: None. IMPRESSION: 1. Nonobstructing 12 mm stone at the lower pole of the left kidney. 2. Otherwise normal sonographic appearance of the kidneys bilaterally. Electronically Signed   By: San Morelle M.D.   On: 01/05/2021 11:38   EKG: sinus rhythm with low voltage in precordial leads with baseline wander.   Echocardiogram: IMPRESSIONS   1. Left ventricular ejection fraction, by estimation, is 60 to 65%. The left ventricle has normal function. The left ventricle has no regional wall motion abnormalities. There is moderate concentric left ventricular hypertrophy. Left ventricular diastolic parameters were normal.   2. Right ventricular systolic function is normal. The right ventricular size is normal. There is normal pulmonary artery systolic pressure. The estimated right ventricular systolic pressure is 95.6 mmHg.   3. The mitral valve is normal in structure. No evidence of mitral valve regurgitation. No evidence of mitral stenosis.   4. The aortic valve is normal in structure. Aortic valve regurgitation is not visualized. No aortic stenosis is present.   5. There is mild dilatation of the aortic root, measuring 40 mm. There is mild dilatation of the ascending aorta, measuring 42 mm.   6. The inferior vena cava is normal in size with <50% respiratory variability, suggesting right atrial pressure of 8 mmHg.   FINDINGS   Left Ventricle: Left ventricular ejection fraction, by estimation, is 60 to 65%. The left ventricle has normal function. The left ventricle has no regional wall motion abnormalities. The left ventricular internal cavity size was normal in size. There is  moderate concentric left ventricular hypertrophy. Left ventricular diastolic parameters were normal. Normal left ventricular filling pressure.   Right Ventricle: The right ventricular size is normal. No increase in right ventricular wall thickness. Right ventricular systolic function is  normal. There is normal pulmonary artery systolic pressure. The tricuspid regurgitant velocity is 1.00 m/s, and  with an assumed right atrial pressure of 8 mmHg, the estimated right ventricular systolic pressure is 21.3 mmHg.   Left Atrium: Left atrial size was normal in size.   Right Atrium: Right atrial size was normal in size.   Pericardium: There is no evidence of pericardial effusion.   Mitral Valve: The mitral valve is normal in structure. No evidence of mitral valve regurgitation. No evidence of mitral valve stenosis.   Tricuspid Valve: The tricuspid valve is normal in structure. Tricuspid valve regurgitation is not demonstrated.  No evidence of tricuspid stenosis.   Aortic Valve: The aortic valve is normal in structure. Aortic valve regurgitation is not visualized. No aortic stenosis is present. Aortic valve mean gradient measures 5.0 mmHg. Aortic valve peak gradient measures 8.1 mmHg. Aortic valve area, by VTI measures 4.66 cm.   Pulmonic Valve: The pulmonic valve was normal in structure. Pulmonic valve regurgitation is not visualized. No evidence of pulmonic stenosis.   Aorta: The aortic root, ascending aorta, aortic arch and descending aorta are all structurally normal, with no evidence of dilitation or obstruction. There is mild dilatation of the aortic root, measuring 40 mm. There is mild dilatation of the ascending aorta, measuring 42 mm.   Venous: The inferior vena cava is normal in size with less than 50% respiratory variability, suggesting right atrial pressure of 8 mmHg.   IAS/Shunts: No atrial level shunt detected by color flow Doppler.      LEFT VENTRICLE  PLAX 2D  LVIDd:         4.70 cm     Diastology  LVIDs:         3.20 cm     LV e' medial:    12.80 cm/s  LV PW:         1.40 cm     LV E/e' medial:  7.6  LV IVS:        1.60 cm     LV e' lateral:   15.10 cm/s  LVOT diam:     2.60 cm     LV E/e' lateral: 6.5  LV SV:         130  LV SV Index:   49  LVOT Area:      5.31 cm     LV Volumes (MOD)  LV vol d, MOD A4C: 63.1 ml  LV vol s, MOD A4C: 27.4 ml  LV SV MOD A4C:     63.1 ml   RIGHT VENTRICLE             IVC  RV S prime:     17.30 cm/s  IVC diam: 2.00 cm  TAPSE (M-mode): 3.8 cm   LEFT ATRIUM             Index      RIGHT ATRIUM          Index  LA diam:        2.80 cm 1.06 cm/m RA Area:     6.12 cm  LA Vol (A2C):   17.1 ml 6.46 ml/m RA Volume:   8.62 ml  3.26 ml/m  LA Vol (A4C):   21.1 ml 7.97 ml/m  LA Biplane Vol: 18.9 ml 7.14 ml/m   AORTIC VALVE                    PULMONIC VALVE  AV Area (Vmax):    4.94 cm     PV Vmax:       0.88 m/s  AV Area (Vmean):   4.20 cm     PV Peak grad:  3.1 mmHg  AV Area (VTI):     4.66 cm  AV Vmax:           142.00 cm/s  AV Vmean:          101.000 cm/s  AV VTI:            0.279 m  AV Peak Grad:      8.1 mmHg  AV Mean Grad:  5.0 mmHg  LVOT Vmax:         132.00 cm/s  LVOT Vmean:        79.900 cm/s  LVOT VTI:          0.245 m  LVOT/AV VTI ratio: 0.88     AORTA  Ao Root diam: 4.00 cm  Ao Asc diam:  4.20 cm   MITRAL VALVE               TRICUSPID VALVE  MV Area (PHT): 3.73 cm    TR Peak grad:   4.0 mmHg  MV E velocity: 97.90 cm/s  TR Vmax:        100.00 cm/s  MV A velocity: 59.60 cm/s  MV E/A ratio:  1.64        SHUNTS                             Systemic VTI:  0.24 m                             Systemic Diam: 2.60 cm   Signed: Farrel Gordon, D.O.  Internal Medicine Resident, PGY-1 [facility]  Pager: [phone] 1:18 PM, 01/08/2021

## 2021-01-08 NOTE — Progress Notes (Signed)
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  No new complaint.  No bleeding.  Objective: Vital signs in last 24 hours: Blood pressure (!) 145/79, pulse 86, temperature 98.2 F (36.8 C), temperature source Oral, resp. rate 14, weight (!) 348 lb 15.8 oz (158.3 kg), SpO2 96 %.  Intake/Output from previous day: 09/07 0701 - 09/08 0700 In: 3994.8 [P.O.:600; I.V.:2166.2; IV Piggyback:1228.6] Out: 3225 [Urine:3225]  Physical Exam: Not performed today    Lab Results: Recent Labs    01/07/21 0742 01/08/21 0019  WBC 5.2 6.1  HGB 10.2* 9.7*  HCT 30.6* 28.0*  PLT 250 245    BMET Recent Labs    01/06/21 0627 01/07/21 0742  NA 140 138  K 4.8 4.5  CL 106 105  CO2 25 22  GLUCOSE 91 89  BUN 81* 62*  CREATININE 4.72* 2.99*  CALCIUM 9.3 9.5    No results found for: CEA1, CEA, CAN199, CA125  Studies/Results: ECHOCARDIOGRAM COMPLETE  Result Date: 01/07/2021    ECHOCARDIOGRAM REPORT   Patient Name:   DESJUAN STEARNS Date of Exam: 01/07/2021 Medical Rec #:  [id number]          Height:       70.0 in Accession #:    [id number]         Weight:       349.4 lb Date of Birth:  [birth year]         BSA:          2.646 m Patient Age:    35 years           BP:           124/67 mmHg Patient Gender: M                  HR:           88 bpm. Exam Location:  Inpatient Procedure: 2D Echo, Cardiac Doppler and Color Doppler Indications:    I42.9 Cardiomyopathy (unspecified)  History:        Patient has no prior history of Echocardiogram examinations.                 Signs/Symptoms:Hypotension.  Sonographer:    [place] Referring Phys: [id number] [place]  1. Left ventricular ejection fraction, by estimation, is 60 to 65%. The left ventricle has normal function. The left ventricle has no regional wall motion abnormalities. There is moderate concentric left ventricular hypertrophy. Left ventricular diastolic parameters were normal.  2. Right ventricular systolic function is  normal. The right ventricular size is normal. There is normal pulmonary artery systolic pressure. The estimated right ventricular systolic pressure is 19.6 mmHg.  3. The mitral valve is normal in structure. No evidence of mitral valve regurgitation. No evidence of mitral stenosis.  4. The aortic valve is normal in structure. Aortic valve regurgitation is not visualized. No aortic stenosis is present.  5. There is mild dilatation of the aortic root, measuring 40 mm. There is mild dilatation of the ascending aorta, measuring 42 mm.  6. The inferior vena cava is normal in size with <50% respiratory variability, suggesting right atrial pressure of 8 mmHg. FINDINGS  Left Ventricle: Left ventricular ejection fraction, by estimation, is 60 to 65%. The left ventricle has normal function. The left ventricle has no regional wall motion abnormalities. The left ventricular internal cavity size was normal in size. There is  moderate concentric left ventricular hypertrophy. Left ventricular diastolic parameters were normal.  Normal left ventricular filling pressure. Right Ventricle: The right ventricular size is normal. No increase in right ventricular wall thickness. Right ventricular systolic function is normal. There is normal pulmonary artery systolic pressure. The tricuspid regurgitant velocity is 1.00 m/s, and  with an assumed right atrial pressure of 8 mmHg, the estimated right ventricular systolic pressure is 89.2 mmHg. Left Atrium: Left atrial size was normal in size. Right Atrium: Right atrial size was normal in size. Pericardium: There is no evidence of pericardial effusion. Mitral Valve: The mitral valve is normal in structure. No evidence of mitral valve regurgitation. No evidence of mitral valve stenosis. Tricuspid Valve: The tricuspid valve is normal in structure. Tricuspid valve regurgitation is not demonstrated. No evidence of tricuspid stenosis. Aortic Valve: The aortic valve is normal in structure. Aortic valve  regurgitation is not visualized. No aortic stenosis is present. Aortic valve mean gradient measures 5.0 mmHg. Aortic valve peak gradient measures 8.1 mmHg. Aortic valve area, by VTI measures 4.66 cm. Pulmonic Valve: The pulmonic valve was normal in structure. Pulmonic valve regurgitation is not visualized. No evidence of pulmonic stenosis. Aorta: The aortic root, ascending aorta, aortic arch and descending aorta are all structurally normal, with no evidence of dilitation or obstruction. There is mild dilatation of the aortic root, measuring 40 mm. There is mild dilatation of the ascending aorta, measuring 42 mm. Venous: The inferior vena cava is normal in size with less than 50% respiratory variability, suggesting right atrial pressure of 8 mmHg. IAS/Shunts: No atrial level shunt detected by color flow Doppler.  LEFT VENTRICLE PLAX 2D LVIDd:         4.70 cm     Diastology LVIDs:         3.20 cm     LV e' medial:    12.80 cm/s LV PW:         1.40 cm     LV E/e' medial:  7.6 LV IVS:        1.60 cm     LV e' lateral:   15.10 cm/s LVOT diam:     2.60 cm     LV E/e' lateral: 6.5 LV SV:         130 LV SV Index:   49 LVOT Area:     5.31 cm  LV Volumes (MOD) LV vol d, MOD A4C: 63.1 ml LV vol s, MOD A4C: 27.4 ml LV SV MOD A4C:     63.1 ml RIGHT VENTRICLE             IVC RV S prime:     17.30 cm/s  IVC diam: 2.00 cm TAPSE (M-mode): 3.8 cm LEFT ATRIUM             Index      RIGHT ATRIUM          Index LA diam:        2.80 cm 1.06 cm/m RA Area:     6.12 cm LA Vol (A2C):   17.1 ml 6.46 ml/m RA Volume:   8.62 ml  3.26 ml/m LA Vol (A4C):   21.1 ml 7.97 ml/m LA Biplane Vol: 18.9 ml 7.14 ml/m  AORTIC VALVE                    PULMONIC VALVE AV Area (Vmax):    4.94 cm     PV Vmax:       0.88 m/s AV Area (Vmean):   4.20 cm     PV  Peak grad:  3.1 mmHg AV Area (VTI):     4.66 cm AV Vmax:           142.00 cm/s AV Vmean:          101.000 cm/s AV VTI:            0.279 m AV Peak Grad:      8.1 mmHg AV Mean Grad:      5.0 mmHg LVOT  Vmax:         132.00 cm/s LVOT Vmean:        79.900 cm/s LVOT VTI:          0.245 m LVOT/AV VTI ratio: 0.88  AORTA Ao Root diam: 4.00 cm Ao Asc diam:  4.20 cm MITRAL VALVE               TRICUSPID VALVE MV Area (PHT): 3.73 cm    TR Peak grad:   4.0 mmHg MV E velocity: 97.90 cm/s  TR Vmax:        100.00 cm/s MV A velocity: 59.60 cm/s MV E/A ratio:  1.64        SHUNTS                            Systemic VTI:  0.24 m                            Systemic Diam: 2.60 cm Fransico Him MD Electronically signed by Fransico Him MD Signature Date/Time: 01/07/2021/12:05:32 PM    Final     Medications: I have reviewed the patient's current medications.  Assessment/Plan:  1. Normocytic anemia 1 unit of packed red blood cells 01/05/2021  2.  ARF 3.  Syncope/hypotension 4.  Pilonidal abscess 5.  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  The hemoglobin has improved since hospital admission.  I reviewed the blood smear again yesterday.  There are a few ovalocytes and teardrops.  The blood smear is otherwise unremarkable.  The blood smear changes are consistent with iron deficiency.  The soluble transferrin receptor level is pending.  We discussed canceling the bone marrow biopsy.  He can begin a trial of ferrous sulfate with close outpatient follow-up of the CBC.  The mild elevation of the kappa free light chains is likely related to renal insufficiency.  The myeloma panel is pending.  Dr. Kai Levins would like to proceed with a bone marrow biopsy.  He understands the risks of the procedure.  Recommendations: Begin ferrous sulfate 325 mg twice daily Follow-up creatinine Outpatient follow-up will be scheduled in the hematology clinic     LOS: 3 days   Betsy Coder, MD   01/08/2021, 8:19 AM

## 2021-01-09 ENCOUNTER — Encounter: Payer: Self-pay | Admitting: *Deleted

## 2021-01-09 ENCOUNTER — Other Ambulatory Visit: Payer: Self-pay | Admitting: *Deleted

## 2021-01-09 DIAGNOSIS — D649 Anemia, unspecified: Secondary | ICD-10-CM

## 2021-01-09 LAB — MULTIPLE MYELOMA PANEL, SERUM
Albumin SerPl Elph-Mcnc: 3.7 g/dL (ref 2.9–4.4)
Albumin/Glob SerPl: 1.4 (ref 0.7–1.7)
Alpha 1: 0.2 g/dL (ref 0.0–0.4)
Alpha2 Glob SerPl Elph-Mcnc: 0.8 g/dL (ref 0.4–1.0)
B-Globulin SerPl Elph-Mcnc: 1.1 g/dL (ref 0.7–1.3)
Gamma Glob SerPl Elph-Mcnc: 0.6 g/dL (ref 0.4–1.8)
Globulin, Total: 2.7 g/dL (ref 2.2–3.9)
IgA: 142 mg/dL (ref 90–386)
IgG (Immunoglobin G), Serum: 724 mg/dL (ref 603–1613)
IgM (Immunoglobulin M), Srm: 33 mg/dL (ref 20–172)
Total Protein ELP: 6.4 g/dL (ref 6.0–8.5)

## 2021-01-09 NOTE — Patient Outreach (Signed)
[facility]) Care Management  01/09/2021  Hunter [name] [birth year] [id number]  Transition of care call/case closure   Referral received:01/06/21 Initial outreach:01/08/21 Insurance: [place] [employer]   Subjective: Initial successful telephone call to patient's preferred number in order to complete transition of care assessment; 2 HIPAA identifiers verified. Explained purpose of call and completed transition of care assessment.  Hunter [name] states that he is doing okay, feeling a little tired but alright . He discussed feeling a little lightheaded when he first stands, discussed changing positions slowly. He report that he has ordered blood pressure monitor and expects it to arrive on tomorrow and he will begin to monitor and log readings.  He reports bone marrow biopsy site is unremarkable. He reports improvement with at piloidal cyst area. He reports tolerating diet, denies bowel or bladder problems.  Reviewed accessing the following [employer] Benefits :   He does not have the hospital indemnity He has used  a [facility] outpatient pharmacy at [employer] of [facility]. He declines need for education or resource information.      Objective:  Hunter [name] was hospitalized at [facility] 9/5-01/08/21 for  Syncope, Acute renal faillure, hypotension, Anemia, Comorbidities include: hypertension, hyperlipidemia, obesity, depression/anxiety, chronic back pain,  He was discharged to home on 01/08/21 without the need for home health services or DME.   Assessment:  Patient voices good understanding of all discharge instructions.  See transition of care flowsheet for assessment details.   Plan:  Reviewed hospital discharge diagnosis of  Acute renal failure, Acute Anemia    and discharge treatment plan using hospital discharge instructions, assessing medication adherence, reviewing problems requiring provider notification, and discussing the importance of follow  up with surgeon, primary care provider and/or specialists as directed.   No ongoing care management needs identified so will close case to [place] Management services and route successful outreach letter with [place] Management pamphlet and 24 Hour Nurse Line Magnet to [place] Management clinical pool to be mailed to patient's home address.  Thanked patient for their services to [facility].  Joylene Draft, RN, BSN  [place] Management Coordinator  [phone]- Mobile [phone]- Toll Free Main Office

## 2021-01-12 ENCOUNTER — Encounter: Payer: Self-pay | Admitting: *Deleted

## 2021-01-13 ENCOUNTER — Inpatient Hospital Stay: Payer: 59

## 2021-01-13 ENCOUNTER — Other Ambulatory Visit: Payer: Self-pay

## 2021-01-13 ENCOUNTER — Inpatient Hospital Stay: Payer: 59 | Attending: Nurse Practitioner | Admitting: Nurse Practitioner

## 2021-01-13 VITALS — BP 115/76 | HR 93 | Temp 98.1°F | Resp 18 | Ht 70.0 in | Wt 353.2 lb

## 2021-01-13 DIAGNOSIS — R55 Syncope and collapse: Secondary | ICD-10-CM | POA: Diagnosis not present

## 2021-01-13 DIAGNOSIS — I959 Hypotension, unspecified: Secondary | ICD-10-CM | POA: Insufficient documentation

## 2021-01-13 DIAGNOSIS — K602 Anal fissure, unspecified: Secondary | ICD-10-CM | POA: Diagnosis not present

## 2021-01-13 DIAGNOSIS — D649 Anemia, unspecified: Secondary | ICD-10-CM | POA: Insufficient documentation

## 2021-01-13 DIAGNOSIS — G8929 Other chronic pain: Secondary | ICD-10-CM | POA: Diagnosis not present

## 2021-01-13 DIAGNOSIS — M4316 Spondylolisthesis, lumbar region: Secondary | ICD-10-CM | POA: Insufficient documentation

## 2021-01-13 DIAGNOSIS — N179 Acute kidney failure, unspecified: Secondary | ICD-10-CM | POA: Insufficient documentation

## 2021-01-13 LAB — BASIC METABOLIC PANEL - CANCER CENTER ONLY
Anion gap: 10 (ref 5–15)
BUN: 44 mg/dL — ABNORMAL HIGH (ref 6–20)
CO2: 23 mmol/L (ref 22–32)
Calcium: 9.9 mg/dL (ref 8.9–10.3)
Chloride: 106 mmol/L (ref 98–111)
Creatinine: 1.45 mg/dL — ABNORMAL HIGH (ref 0.61–1.24)
GFR, Estimated: >60 mL/min (ref >60)
Glucose, Bld: 107 mg/dL — ABNORMAL HIGH (ref 70–99)
Potassium: 4.1 mmol/L (ref 3.5–5.1)
Sodium: 139 mmol/L (ref 135–145)

## 2021-01-13 LAB — CBC WITH DIFFERENTIAL (CANCER CENTER ONLY)
Abs Immature Granulocytes: 0.06 10*3/uL (ref 0.00–0.07)
Basophils Absolute: 0.1 10*3/uL (ref 0.0–0.1)
Basophils Relative: 1 %
Eosinophils Absolute: 0.2 10*3/uL (ref 0.0–0.5)
Eosinophils Relative: 3 %
HCT: 27.4 % — ABNORMAL LOW (ref 39.0–52.0)
Hemoglobin: 9.1 g/dL — ABNORMAL LOW (ref 13.0–17.0)
Immature Granulocytes: 1 %
Lymphocytes Relative: 23 %
Lymphs Abs: 1.4 10*3/uL (ref 0.7–4.0)
MCH: 29.9 pg (ref 26.0–34.0)
MCHC: 33.2 g/dL (ref 30.0–36.0)
MCV: 90.1 fL (ref 80.0–100.0)
Monocytes Absolute: 0.5 10*3/uL (ref 0.1–1.0)
Monocytes Relative: 8 %
Neutro Abs: 3.9 10*3/uL (ref 1.7–7.7)
Neutrophils Relative %: 64 %
Platelet Count: 269 10*3/uL (ref 150–400)
RBC: 3.04 MIL/uL — ABNORMAL LOW (ref 4.22–5.81)
RDW: 12.8 % (ref 11.5–15.5)
WBC Count: 6 10*3/uL (ref 4.0–10.5)
nRBC: 0 % (ref 0.0–0.2)

## 2021-01-13 LAB — RETIC PANEL
Immature Retic Fract: 20.8 % — ABNORMAL HIGH (ref 2.3–15.9)
RBC.: 3.06 MIL/uL — ABNORMAL LOW (ref 4.22–5.81)
Retic Count, Absolute: 137.4 10*3/uL (ref 19.0–186.0)
Retic Ct Pct: 4.5 % — ABNORMAL HIGH (ref 0.4–3.1)
Reticulocyte Hemoglobin: 33 pg (ref >27.9)

## 2021-01-13 NOTE — Progress Notes (Signed)
  [facility] OFFICE PROGRESS NOTE   Diagnosis: Anemia  INTERVAL HISTORY:   Hunter [name] is seen in his first outpatient visit since hospital discharge.  Main complaint is being tired.  Not aware of any bleeding except related to an anal fissure.  No black stools.  No shortness of breath.  Objective:  Vital signs in last 24 hours:  Blood pressure 115/76, pulse 93, temperature 98.1 F (36.7 C), temperature source Oral, resp. rate 18, height 5' 10" (1.778 m), weight (!) 353 lb 3.2 oz (160.2 kg), SpO2 98 %.    Resp: Lungs clear bilaterally. Cardio: Regular rate and rhythm. GI: No hepatosplenomegaly.  External hemorrhoid. Vascular: No leg edema. Skin: Pilonidal abscess appears healed at left upper gluteal fold.  Soft hemorrhoids at the anal verge   Lab Results:  Lab Results  Component Value Date   WBC 6.0 01/13/2021   HGB 9.1 (L) 01/13/2021   HCT 27.4 (L) 01/13/2021   MCV 90.1 01/13/2021   PLT 269 01/13/2021   NEUTROABS 3.9 01/13/2021    Imaging:  No results found.  Medications: I have reviewed the patient's current medications.  Assessment/Plan: 1. Normocytic anemia 1 unit of packed red blood cells 01/05/2021 Bone marrow biopsy 01/08/2021-normocellular bone marrow with trilineage hematopoiesis.  Mild nonspecific changes involving myeloid cell lines with no increase in blasts.  No morphologic evidence of a lymphoproliferative process.  Storage iron is increased.   2.  ARF 3.  Syncope/hypotension 4.  Pilonidal abscess 5.  Chronic back pain with L3 spondylolisthesis    Disposition: Hunter [name] appears stable.  Etiology of the anemia remains unclear.  He may have had an acute bleeding episode.  CBC from today shows hemoglobin 9.1.  We are adding reticulocyte count.  He will complete stool cards.  Recommend he follow-up with Dr. Samule Ohm, consider colonoscopy.  He will begin ferrous sulfate 325 mg twice daily.  Creatinine is better, 1.45 today.  Follow-up  CBC/reticulocyte count in 2 weeks.  Office visit in 4 weeks.  He will contact the office in the interim with signs/symptoms suggestive of progressive anemia.  Patient seen with Dr. Benay Spice.    Ned Card ANP/GNP-BC   01/13/2021  8:17 AM This was a shared visit Ned Card.  Hunter [name] was interviewed and examined.  The renal function has improved.  He has persistent anemia.  The etiology of the anemia is unclear.  The bone marrow biopsy is unremarkable with increased iron stores.  We suspect he may have experienced an acute bleeding event.  He reports intermittent rectal bleeding that he relates to an anal fissure.  He will submit a set of stool Hemoccult cards.  He will begin a trial of ferrous sulfate.  The reticulocyte count is higher today.  We recommend he follow-up with Dr. Samule Ohm to consider a colonoscopy.  I was present for greater than 50% of today's visit.  I performed medical decision making.  Julieanne Manson, MD

## 2021-01-14 ENCOUNTER — Other Ambulatory Visit (HOSPITAL_BASED_OUTPATIENT_CLINIC_OR_DEPARTMENT_OTHER): Payer: Self-pay

## 2021-01-15 DIAGNOSIS — K921 Melena: Secondary | ICD-10-CM | POA: Diagnosis not present

## 2021-01-15 DIAGNOSIS — D649 Anemia, unspecified: Secondary | ICD-10-CM | POA: Diagnosis not present

## 2021-01-16 ENCOUNTER — Other Ambulatory Visit (HOSPITAL_BASED_OUTPATIENT_CLINIC_OR_DEPARTMENT_OTHER): Payer: Self-pay

## 2021-01-16 ENCOUNTER — Encounter (HOSPITAL_COMMUNITY): Payer: Self-pay

## 2021-01-16 MED ORDER — METHYLPHENIDATE HCL ER (OSM) 36 MG PO TBCR
EXTENDED_RELEASE_TABLET | ORAL | 0 refills | Status: DC
  Filled 2021-01-16: qty 30, 30d supply, fill #0

## 2021-01-16 MED ORDER — METHYLPHENIDATE HCL 5 MG PO TABS
ORAL_TABLET | ORAL | 0 refills | Status: DC
  Filled 2021-01-16: qty 30, 30d supply, fill #0

## 2021-01-19 ENCOUNTER — Other Ambulatory Visit (HOSPITAL_BASED_OUTPATIENT_CLINIC_OR_DEPARTMENT_OTHER): Payer: Self-pay

## 2021-01-20 ENCOUNTER — Other Ambulatory Visit (HOSPITAL_BASED_OUTPATIENT_CLINIC_OR_DEPARTMENT_OTHER): Payer: Self-pay

## 2021-01-21 ENCOUNTER — Other Ambulatory Visit (HOSPITAL_BASED_OUTPATIENT_CLINIC_OR_DEPARTMENT_OTHER): Payer: Self-pay

## 2021-01-23 ENCOUNTER — Other Ambulatory Visit (HOSPITAL_BASED_OUTPATIENT_CLINIC_OR_DEPARTMENT_OTHER): Payer: Self-pay

## 2021-01-26 ENCOUNTER — Other Ambulatory Visit (HOSPITAL_BASED_OUTPATIENT_CLINIC_OR_DEPARTMENT_OTHER): Payer: Self-pay

## 2021-01-26 MED ORDER — TIZANIDINE HCL 4 MG PO TABS
4.0000 mg | ORAL_TABLET | Freq: Three times a day (TID) | ORAL | 1 refills | Status: DC | PRN
  Filled 2021-01-26: qty 90, 30d supply, fill #0
  Filled 2021-10-28: qty 90, 30d supply, fill #1

## 2021-01-26 MED ORDER — TRAMADOL HCL 50 MG PO TABS
50.0000 mg | ORAL_TABLET | Freq: Three times a day (TID) | ORAL | 1 refills | Status: DC | PRN
  Filled 2021-01-26: qty 180, 30d supply, fill #0

## 2021-01-30 ENCOUNTER — Telehealth: Payer: Self-pay | Admitting: Oncology

## 2021-01-30 NOTE — Telephone Encounter (Signed)
Called pt per 9/29 sch message and scheduled labs. Unable to reach patient. Left message with appointment date and time

## 2021-02-02 ENCOUNTER — Inpatient Hospital Stay: Payer: 59 | Attending: Oncology

## 2021-02-02 DIAGNOSIS — N179 Acute kidney failure, unspecified: Secondary | ICD-10-CM | POA: Insufficient documentation

## 2021-02-02 DIAGNOSIS — D649 Anemia, unspecified: Secondary | ICD-10-CM | POA: Insufficient documentation

## 2021-02-02 DIAGNOSIS — L0501 Pilonidal cyst with abscess: Secondary | ICD-10-CM | POA: Insufficient documentation

## 2021-02-02 DIAGNOSIS — M4316 Spondylolisthesis, lumbar region: Secondary | ICD-10-CM | POA: Insufficient documentation

## 2021-02-02 DIAGNOSIS — I959 Hypotension, unspecified: Secondary | ICD-10-CM | POA: Insufficient documentation

## 2021-02-02 DIAGNOSIS — G8929 Other chronic pain: Secondary | ICD-10-CM | POA: Insufficient documentation

## 2021-02-02 DIAGNOSIS — M549 Dorsalgia, unspecified: Secondary | ICD-10-CM | POA: Insufficient documentation

## 2021-02-02 DIAGNOSIS — R55 Syncope and collapse: Secondary | ICD-10-CM | POA: Insufficient documentation

## 2021-02-02 LAB — SURGICAL PATHOLOGY

## 2021-02-03 ENCOUNTER — Other Ambulatory Visit (HOSPITAL_BASED_OUTPATIENT_CLINIC_OR_DEPARTMENT_OTHER): Payer: Self-pay

## 2021-02-03 MED ORDER — BUPROPION HCL ER (XL) 300 MG PO TB24
ORAL_TABLET | ORAL | 1 refills | Status: DC
  Filled 2021-02-03: qty 90, 90d supply, fill #0

## 2021-02-04 DIAGNOSIS — D509 Iron deficiency anemia, unspecified: Secondary | ICD-10-CM | POA: Diagnosis not present

## 2021-02-04 DIAGNOSIS — K921 Melena: Secondary | ICD-10-CM | POA: Diagnosis not present

## 2021-02-04 DIAGNOSIS — K649 Unspecified hemorrhoids: Secondary | ICD-10-CM | POA: Diagnosis not present

## 2021-02-05 DIAGNOSIS — I10 Essential (primary) hypertension: Secondary | ICD-10-CM | POA: Diagnosis not present

## 2021-02-05 DIAGNOSIS — E782 Mixed hyperlipidemia: Secondary | ICD-10-CM | POA: Diagnosis not present

## 2021-02-05 DIAGNOSIS — Z23 Encounter for immunization: Secondary | ICD-10-CM | POA: Diagnosis not present

## 2021-02-05 DIAGNOSIS — D649 Anemia, unspecified: Secondary | ICD-10-CM | POA: Diagnosis not present

## 2021-02-05 DIAGNOSIS — N179 Acute kidney failure, unspecified: Secondary | ICD-10-CM | POA: Diagnosis not present

## 2021-02-06 ENCOUNTER — Other Ambulatory Visit (HOSPITAL_BASED_OUTPATIENT_CLINIC_OR_DEPARTMENT_OTHER): Payer: Self-pay

## 2021-02-06 MED ORDER — EZETIMIBE 10 MG PO TABS
ORAL_TABLET | ORAL | 5 refills | Status: DC
  Filled 2021-02-06: qty 90, 90d supply, fill #0
  Filled 2021-05-13: qty 90, 90d supply, fill #1

## 2021-02-10 ENCOUNTER — Other Ambulatory Visit: Payer: Self-pay

## 2021-02-10 ENCOUNTER — Telehealth: Payer: Self-pay

## 2021-02-10 ENCOUNTER — Inpatient Hospital Stay: Payer: 59 | Admitting: Oncology

## 2021-02-10 ENCOUNTER — Inpatient Hospital Stay: Payer: 59

## 2021-02-10 VITALS — BP 155/73 | HR 94 | Temp 97.8°F | Resp 18 | Ht 70.0 in | Wt 354.0 lb

## 2021-02-10 DIAGNOSIS — R55 Syncope and collapse: Secondary | ICD-10-CM | POA: Diagnosis not present

## 2021-02-10 DIAGNOSIS — M4316 Spondylolisthesis, lumbar region: Secondary | ICD-10-CM | POA: Diagnosis not present

## 2021-02-10 DIAGNOSIS — L0501 Pilonidal cyst with abscess: Secondary | ICD-10-CM | POA: Diagnosis not present

## 2021-02-10 DIAGNOSIS — M549 Dorsalgia, unspecified: Secondary | ICD-10-CM | POA: Diagnosis not present

## 2021-02-10 DIAGNOSIS — D649 Anemia, unspecified: Secondary | ICD-10-CM | POA: Diagnosis not present

## 2021-02-10 DIAGNOSIS — G8929 Other chronic pain: Secondary | ICD-10-CM | POA: Diagnosis not present

## 2021-02-10 DIAGNOSIS — I959 Hypotension, unspecified: Secondary | ICD-10-CM | POA: Diagnosis not present

## 2021-02-10 DIAGNOSIS — N179 Acute kidney failure, unspecified: Secondary | ICD-10-CM | POA: Diagnosis not present

## 2021-02-10 LAB — BASIC METABOLIC PANEL - CANCER CENTER ONLY
Anion gap: 9 (ref 5–15)
BUN: 20 mg/dL (ref 6–20)
CO2: 25 mmol/L (ref 22–32)
Calcium: 9.6 mg/dL (ref 8.9–10.3)
Chloride: 108 mmol/L (ref 98–111)
Creatinine: 0.74 mg/dL (ref 0.61–1.24)
GFR, Estimated: >60 mL/min (ref >60)
Glucose, Bld: 162 mg/dL — ABNORMAL HIGH (ref 70–99)
Potassium: 4 mmol/L (ref 3.5–5.1)
Sodium: 142 mmol/L (ref 135–145)

## 2021-02-10 LAB — CBC WITH DIFFERENTIAL (CANCER CENTER ONLY)
Abs Immature Granulocytes: 0.06 10*3/uL (ref 0.00–0.07)
Basophils Absolute: 0 10*3/uL (ref 0.0–0.1)
Basophils Relative: 1 %
Eosinophils Absolute: 0.2 10*3/uL (ref 0.0–0.5)
Eosinophils Relative: 4 %
HCT: 35.9 % — ABNORMAL LOW (ref 39.0–52.0)
Hemoglobin: 11.9 g/dL — ABNORMAL LOW (ref 13.0–17.0)
Immature Granulocytes: 1 %
Lymphocytes Relative: 27 %
Lymphs Abs: 1.7 10*3/uL (ref 0.7–4.0)
MCH: 29.9 pg (ref 26.0–34.0)
MCHC: 33.1 g/dL (ref 30.0–36.0)
MCV: 90.2 fL (ref 80.0–100.0)
Monocytes Absolute: 0.3 10*3/uL (ref 0.1–1.0)
Monocytes Relative: 5 %
Neutro Abs: 4 10*3/uL (ref 1.7–7.7)
Neutrophils Relative %: 62 %
Platelet Count: 265 10*3/uL (ref 150–400)
RBC: 3.98 MIL/uL — ABNORMAL LOW (ref 4.22–5.81)
RDW: 12.5 % (ref 11.5–15.5)
WBC Count: 6.3 10*3/uL (ref 4.0–10.5)
nRBC: 0 % (ref 0.0–0.2)

## 2021-02-10 LAB — RETIC PANEL
Immature Retic Fract: 16.8 % — ABNORMAL HIGH (ref 2.3–15.9)
RBC.: 3.94 MIL/uL — ABNORMAL LOW (ref 4.22–5.81)
Retic Count, Absolute: 160 10*3/uL (ref 19.0–186.0)
Retic Ct Pct: 4.1 % — ABNORMAL HIGH (ref 0.4–3.1)
Reticulocyte Hemoglobin: 32.5 pg (ref >27.9)

## 2021-02-10 NOTE — Telephone Encounter (Addendum)
TC to Dr Marlane Hatcher [phone] to obtain colonoscopy report. Awaiting fax

## 2021-02-10 NOTE — Progress Notes (Signed)
  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  He feels well.  He reports fatigue and exertional dyspnea over the week following discharge from the hospital.  This has resolved.  No bleeding.  He reports the anal fissure and pilonidal cyst have healed.  He underwent a colonoscopy by Dr. Watt Climes (we do not have the report today).  He reports the colonoscopy was normal.  He is taking iron twice daily.  Objective:  Vital signs in last 24 hours:  Blood pressure (!) 155/73, pulse 94, temperature 97.8 F (36.6 C), temperature source Oral, resp. rate 18, height $RemoveBe'5\' 10"'FtUSLBPAf$  (1.778 m), weight (!) 354 lb (160.6 kg), SpO2 100 %.     Resp: Lungs clear bilaterally Cardio: Regular rate and rhythm GI: No hepatosplenomegaly Vascular: No leg edema     Lab Results:  Lab Results  Component Value Date   WBC 6.3 02/10/2021   HGB 11.9 (L) 02/10/2021   HCT 35.9 (L) 02/10/2021   MCV 90.2 02/10/2021   PLT 265 02/10/2021   NEUTROABS 4.0 02/10/2021    CMP  Lab Results  Component Value Date   NA 139 01/13/2021   K 4.1 01/13/2021   CL 106 01/13/2021   CO2 23 01/13/2021   GLUCOSE 107 (H) 01/13/2021   BUN 44 (H) 01/13/2021   CREATININE 1.45 (H) 01/13/2021   CALCIUM 9.9 01/13/2021   PROT 6.8 01/06/2021   ALBUMIN 3.6 01/06/2021   AST 30 01/06/2021   ALT 45 (H) 01/06/2021   ALKPHOS 13 (L) 01/06/2021   BILITOT 0.6 01/06/2021   GFRNONAA >60 01/13/2021    Medications: I have reviewed the patient's current medications.   Assessment/Plan:  Normocytic anemia 1 unit of packed red blood cells 01/05/2021 Bone marrow biopsy 01/08/2021-normocellular bone marrow with trilineage hematopoiesis.  Mild nonspecific changes involving myeloid cell lines with no increase in blasts.  No morphologic evidence of a lymphoproliferative process.  Storage iron is increased.   2.  ARF 3.  Syncope/hypotension 4.  Pilonidal abscess 5.  Chronic  back pain with L3 spondylolisthesis      Disposition:  Dr.Zetina appears well.  The hemoglobin has increased over the past month.  We will follow-up on the chemistry panel from today.  The etiology of the severe anemia remains unclear.  We will request the colonoscopy report from Dr. Watt Climes.  He will discontinue iron.  He will return for an office visit and repeat CBC in 2 months.  He will call for symptoms of anemia.  Betsy Coder, MD  02/10/2021  8:37 AM

## 2021-02-12 ENCOUNTER — Other Ambulatory Visit (HOSPITAL_BASED_OUTPATIENT_CLINIC_OR_DEPARTMENT_OTHER): Payer: Self-pay

## 2021-02-13 DIAGNOSIS — F3342 Major depressive disorder, recurrent, in full remission: Secondary | ICD-10-CM | POA: Diagnosis not present

## 2021-02-13 DIAGNOSIS — F902 Attention-deficit hyperactivity disorder, combined type: Secondary | ICD-10-CM | POA: Diagnosis not present

## 2021-02-16 ENCOUNTER — Other Ambulatory Visit (HOSPITAL_BASED_OUTPATIENT_CLINIC_OR_DEPARTMENT_OTHER): Payer: Self-pay

## 2021-02-16 MED ORDER — BUPROPION HCL ER (XL) 300 MG PO TB24
ORAL_TABLET | ORAL | 1 refills | Status: DC
  Filled 2021-02-16: qty 90, 90d supply, fill #0

## 2021-02-16 MED ORDER — METHYLPHENIDATE HCL ER (OSM) 36 MG PO TBCR
EXTENDED_RELEASE_TABLET | ORAL | 0 refills | Status: DC
  Filled 2021-02-16 – 2021-06-29 (×2): qty 30, 30d supply, fill #0

## 2021-02-16 MED ORDER — METHYLPHENIDATE HCL ER (OSM) 36 MG PO TBCR
EXTENDED_RELEASE_TABLET | ORAL | 0 refills | Status: DC
  Filled 2021-02-16 – 2021-04-20 (×2): qty 30, 30d supply, fill #0

## 2021-02-16 MED ORDER — METHYLPHENIDATE HCL ER (OSM) 36 MG PO TBCR
EXTENDED_RELEASE_TABLET | ORAL | 0 refills | Status: DC
  Filled 2021-02-16: qty 30, 30d supply, fill #0

## 2021-02-20 ENCOUNTER — Other Ambulatory Visit (HOSPITAL_BASED_OUTPATIENT_CLINIC_OR_DEPARTMENT_OTHER): Payer: Self-pay

## 2021-02-23 ENCOUNTER — Other Ambulatory Visit (HOSPITAL_BASED_OUTPATIENT_CLINIC_OR_DEPARTMENT_OTHER): Payer: Self-pay

## 2021-02-23 MED ORDER — FENOFIBRATE 160 MG PO TABS
ORAL_TABLET | ORAL | 1 refills | Status: DC
  Filled 2021-02-23: qty 90, 90d supply, fill #0
  Filled 2021-06-02: qty 90, 90d supply, fill #1

## 2021-03-05 ENCOUNTER — Other Ambulatory Visit (HOSPITAL_BASED_OUTPATIENT_CLINIC_OR_DEPARTMENT_OTHER): Payer: Self-pay

## 2021-03-05 DIAGNOSIS — M549 Dorsalgia, unspecified: Secondary | ICD-10-CM | POA: Diagnosis not present

## 2021-03-05 DIAGNOSIS — Z9889 Other specified postprocedural states: Secondary | ICD-10-CM | POA: Diagnosis not present

## 2021-03-05 DIAGNOSIS — M961 Postlaminectomy syndrome, not elsewhere classified: Secondary | ICD-10-CM | POA: Diagnosis not present

## 2021-03-05 DIAGNOSIS — Z79899 Other long term (current) drug therapy: Secondary | ICD-10-CM | POA: Diagnosis not present

## 2021-03-05 DIAGNOSIS — M542 Cervicalgia: Secondary | ICD-10-CM | POA: Diagnosis not present

## 2021-03-05 DIAGNOSIS — M533 Sacrococcygeal disorders, not elsewhere classified: Secondary | ICD-10-CM | POA: Diagnosis not present

## 2021-03-05 DIAGNOSIS — Z5181 Encounter for therapeutic drug level monitoring: Secondary | ICD-10-CM | POA: Diagnosis not present

## 2021-03-05 DIAGNOSIS — M5416 Radiculopathy, lumbar region: Secondary | ICD-10-CM | POA: Diagnosis not present

## 2021-03-05 MED ORDER — TIZANIDINE HCL 4 MG PO TABS
4.0000 mg | ORAL_TABLET | Freq: Three times a day (TID) | ORAL | 1 refills | Status: DC | PRN
  Filled 2021-03-05: qty 90, 30d supply, fill #0
  Filled 2021-04-20: qty 90, 30d supply, fill #1

## 2021-03-05 MED ORDER — HYDROCODONE-ACETAMINOPHEN 5-325 MG PO TABS
1.0000 | ORAL_TABLET | Freq: Every evening | ORAL | 0 refills | Status: DC | PRN
  Filled 2021-03-05: qty 15, 15d supply, fill #0

## 2021-03-05 MED ORDER — TRAMADOL HCL 50 MG PO TABS
50.0000 mg | ORAL_TABLET | Freq: Three times a day (TID) | ORAL | 2 refills | Status: DC | PRN
  Filled 2021-03-05: qty 180, 30d supply, fill #0
  Filled 2021-04-20: qty 180, 30d supply, fill #1

## 2021-03-05 MED ORDER — GABAPENTIN 300 MG PO CAPS
300.0000 mg | ORAL_CAPSULE | Freq: Three times a day (TID) | ORAL | 2 refills | Status: DC
  Filled 2021-03-05: qty 270, 90d supply, fill #0
  Filled 2021-06-08: qty 270, 90d supply, fill #1
  Filled 2021-10-05: qty 270, 90d supply, fill #2

## 2021-03-06 ENCOUNTER — Other Ambulatory Visit (HOSPITAL_BASED_OUTPATIENT_CLINIC_OR_DEPARTMENT_OTHER): Payer: Self-pay

## 2021-03-13 DIAGNOSIS — Z3189 Encounter for other procreative management: Secondary | ICD-10-CM | POA: Diagnosis not present

## 2021-04-02 ENCOUNTER — Other Ambulatory Visit (HOSPITAL_BASED_OUTPATIENT_CLINIC_OR_DEPARTMENT_OTHER): Payer: Self-pay

## 2021-04-02 MED ORDER — LISINOPRIL 40 MG PO TABS
40.0000 mg | ORAL_TABLET | Freq: Every day | ORAL | 2 refills | Status: DC
  Filled 2021-04-02: qty 90, 90d supply, fill #0
  Filled 2021-06-29: qty 90, 90d supply, fill #1
  Filled 2021-10-05: qty 90, 90d supply, fill #2

## 2021-04-03 ENCOUNTER — Other Ambulatory Visit (HOSPITAL_BASED_OUTPATIENT_CLINIC_OR_DEPARTMENT_OTHER): Payer: Self-pay

## 2021-04-06 ENCOUNTER — Other Ambulatory Visit: Payer: Self-pay

## 2021-04-06 ENCOUNTER — Inpatient Hospital Stay: Payer: 59 | Attending: Oncology

## 2021-04-06 ENCOUNTER — Inpatient Hospital Stay: Payer: 59 | Admitting: Nurse Practitioner

## 2021-04-06 ENCOUNTER — Encounter: Payer: Self-pay | Admitting: Nurse Practitioner

## 2021-04-06 VITALS — BP 111/74 | HR 96 | Temp 97.8°F | Resp 18 | Ht 70.0 in | Wt 363.0 lb

## 2021-04-06 DIAGNOSIS — D649 Anemia, unspecified: Secondary | ICD-10-CM

## 2021-04-06 DIAGNOSIS — G8929 Other chronic pain: Secondary | ICD-10-CM | POA: Insufficient documentation

## 2021-04-06 DIAGNOSIS — I959 Hypotension, unspecified: Secondary | ICD-10-CM | POA: Diagnosis not present

## 2021-04-06 DIAGNOSIS — R55 Syncope and collapse: Secondary | ICD-10-CM | POA: Insufficient documentation

## 2021-04-06 DIAGNOSIS — M4316 Spondylolisthesis, lumbar region: Secondary | ICD-10-CM | POA: Insufficient documentation

## 2021-04-06 DIAGNOSIS — N179 Acute kidney failure, unspecified: Secondary | ICD-10-CM | POA: Insufficient documentation

## 2021-04-06 LAB — CBC WITH DIFFERENTIAL (CANCER CENTER ONLY)
Abs Immature Granulocytes: 0.03 10*3/uL (ref 0.00–0.07)
Basophils Absolute: 0 10*3/uL (ref 0.0–0.1)
Basophils Relative: 1 %
Eosinophils Absolute: 0.2 10*3/uL (ref 0.0–0.5)
Eosinophils Relative: 3 %
HCT: 39.9 % (ref 39.0–52.0)
Hemoglobin: 13.4 g/dL (ref 13.0–17.0)
Immature Granulocytes: 1 %
Lymphocytes Relative: 32 %
Lymphs Abs: 1.8 10*3/uL (ref 0.7–4.0)
MCH: 29.3 pg (ref 26.0–34.0)
MCHC: 33.6 g/dL (ref 30.0–36.0)
MCV: 87.1 fL (ref 80.0–100.0)
Monocytes Absolute: 0.3 10*3/uL (ref 0.1–1.0)
Monocytes Relative: 6 %
Neutro Abs: 3.3 10*3/uL (ref 1.7–7.7)
Neutrophils Relative %: 57 %
Platelet Count: 248 10*3/uL (ref 150–400)
RBC: 4.58 MIL/uL (ref 4.22–5.81)
RDW: 11.9 % (ref 11.5–15.5)
WBC Count: 5.7 10*3/uL (ref 4.0–10.5)
nRBC: 0 % (ref 0.0–0.2)

## 2021-04-06 NOTE — Progress Notes (Signed)
  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  He feels well.  No complaints.  Specifically no bleeding.  Stable chronic back pain.  Objective:  Vital signs in last 24 hours:  Blood pressure 111/74, pulse 96, temperature 97.8 F (36.6 C), temperature source Oral, resp. rate 18, height $RemoveBe'5\' 10"'jrnGuKdGg$  (1.778 m), weight (!) 363 lb (164.7 kg), SpO2 98 %.     Resp: Lungs clear bilaterally. Cardio: Regular rate and rhythm. GI: Abdomen soft and nontender.  No hepatosplenomegaly. Vascular: Trace lower leg edema bilaterally.   Lab Results:  Lab Results  Component Value Date   WBC 5.7 04/06/2021   HGB 13.4 04/06/2021   HCT 39.9 04/06/2021   MCV 87.1 04/06/2021   PLT 248 04/06/2021   NEUTROABS 3.3 04/06/2021    Imaging:  No results found.  Medications: I have reviewed the patient's current medications.  Assessment/Plan:  Normocytic anemia 1 unit of packed red blood cells 01/05/2021 Bone marrow biopsy 01/08/2021-normocellular bone marrow with trilineage hematopoiesis.  Mild nonspecific changes involving myeloid cell lines with no increase in blasts.  No morphologic evidence of a lymphoproliferative process.  Storage iron is increased. Colonoscopy 02/04/2021-hemorrhoids found on perianal exam.  Entire examined colon normal.  No specimens collected.   2.  ARF 3.  Syncope/hypotension 4.  Pilonidal abscess 5.  Chronic back pain with L3 spondylolisthesis  Disposition: 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  The hemoglobin has corrected into normal range.  Etiology of the previous anemia is unclear.  He will return for a CBC and follow-up visit in approximately 8 months.  He understands to contact the office with signs/symptoms suggestive of anemia.  Patient seen with Dr. Benay Spice.   Ned Card ANP/GNP-BC   04/06/2021  8:17 AM This was a shared visit with Ned Card.  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  The hemoglobin is now  normal.  The etiology of the anemia several months ago remains unclear.  He will call for symptoms of anemia.   I was present for greater than 50% of today's visit.  I performed medical decision making.  Julieanne Manson, MD

## 2021-04-09 DIAGNOSIS — Z3141 Encounter for fertility testing: Secondary | ICD-10-CM | POA: Diagnosis not present

## 2021-04-20 ENCOUNTER — Other Ambulatory Visit (HOSPITAL_BASED_OUTPATIENT_CLINIC_OR_DEPARTMENT_OTHER): Payer: Self-pay

## 2021-04-21 ENCOUNTER — Other Ambulatory Visit (HOSPITAL_BASED_OUTPATIENT_CLINIC_OR_DEPARTMENT_OTHER): Payer: Self-pay

## 2021-04-22 ENCOUNTER — Other Ambulatory Visit (HOSPITAL_BASED_OUTPATIENT_CLINIC_OR_DEPARTMENT_OTHER): Payer: Self-pay

## 2021-05-05 DIAGNOSIS — F902 Attention-deficit hyperactivity disorder, combined type: Secondary | ICD-10-CM | POA: Diagnosis not present

## 2021-05-05 DIAGNOSIS — F3342 Major depressive disorder, recurrent, in full remission: Secondary | ICD-10-CM | POA: Diagnosis not present

## 2021-05-06 ENCOUNTER — Other Ambulatory Visit (HOSPITAL_BASED_OUTPATIENT_CLINIC_OR_DEPARTMENT_OTHER): Payer: Self-pay

## 2021-05-06 MED ORDER — METHYLPHENIDATE HCL ER (OSM) 36 MG PO TBCR
36.0000 mg | EXTENDED_RELEASE_TABLET | Freq: Every morning | ORAL | 0 refills | Status: DC
  Filled 2021-08-04: qty 30, 30d supply, fill #0

## 2021-05-06 MED ORDER — METHYLPHENIDATE HCL 5 MG PO TABS
5.0000 mg | ORAL_TABLET | Freq: Every evening | ORAL | 0 refills | Status: DC
  Filled 2021-05-06: qty 30, 30d supply, fill #0

## 2021-05-06 MED ORDER — METHYLPHENIDATE HCL ER (OSM) 36 MG PO TBCR
36.0000 mg | EXTENDED_RELEASE_TABLET | Freq: Every morning | ORAL | 0 refills | Status: DC
  Filled 2021-05-06 – 2021-05-14 (×2): qty 30, 30d supply, fill #0

## 2021-05-06 MED ORDER — METHYLPHENIDATE HCL ER (OSM) 36 MG PO TBCR
36.0000 mg | EXTENDED_RELEASE_TABLET | Freq: Every morning | ORAL | 0 refills | Status: DC
  Filled 2021-09-14: qty 30, 30d supply, fill #0

## 2021-05-06 MED ORDER — BUPROPION HCL ER (XL) 300 MG PO TB24
300.0000 mg | ORAL_TABLET | Freq: Every morning | ORAL | 1 refills | Status: DC
  Filled 2021-05-06: qty 90, 90d supply, fill #0
  Filled 2021-08-17: qty 90, 90d supply, fill #1

## 2021-05-13 ENCOUNTER — Other Ambulatory Visit (HOSPITAL_BASED_OUTPATIENT_CLINIC_OR_DEPARTMENT_OTHER): Payer: Self-pay

## 2021-05-14 ENCOUNTER — Other Ambulatory Visit (HOSPITAL_BASED_OUTPATIENT_CLINIC_OR_DEPARTMENT_OTHER): Payer: Self-pay

## 2021-06-02 ENCOUNTER — Other Ambulatory Visit (HOSPITAL_BASED_OUTPATIENT_CLINIC_OR_DEPARTMENT_OTHER): Payer: Self-pay

## 2021-06-04 ENCOUNTER — Other Ambulatory Visit (HOSPITAL_BASED_OUTPATIENT_CLINIC_OR_DEPARTMENT_OTHER): Payer: Self-pay

## 2021-06-04 DIAGNOSIS — M549 Dorsalgia, unspecified: Secondary | ICD-10-CM | POA: Diagnosis not present

## 2021-06-04 DIAGNOSIS — M5417 Radiculopathy, lumbosacral region: Secondary | ICD-10-CM | POA: Diagnosis not present

## 2021-06-04 DIAGNOSIS — M542 Cervicalgia: Secondary | ICD-10-CM | POA: Diagnosis not present

## 2021-06-04 DIAGNOSIS — M961 Postlaminectomy syndrome, not elsewhere classified: Secondary | ICD-10-CM | POA: Diagnosis not present

## 2021-06-04 DIAGNOSIS — M533 Sacrococcygeal disorders, not elsewhere classified: Secondary | ICD-10-CM | POA: Diagnosis not present

## 2021-06-04 DIAGNOSIS — Z9889 Other specified postprocedural states: Secondary | ICD-10-CM | POA: Diagnosis not present

## 2021-06-04 DIAGNOSIS — M5416 Radiculopathy, lumbar region: Secondary | ICD-10-CM | POA: Diagnosis not present

## 2021-06-04 MED ORDER — HYDROCODONE-ACETAMINOPHEN 5-325 MG PO TABS
ORAL_TABLET | ORAL | 0 refills | Status: DC
  Filled 2021-06-04: qty 15, 30d supply, fill #0

## 2021-06-04 MED ORDER — TIZANIDINE HCL 4 MG PO TABS
4.0000 mg | ORAL_TABLET | Freq: Three times a day (TID) | ORAL | 2 refills | Status: DC | PRN
  Filled 2021-06-04: qty 90, 30d supply, fill #0
  Filled 2021-07-27: qty 90, 30d supply, fill #1

## 2021-06-04 MED ORDER — TRAMADOL HCL 50 MG PO TABS
50.0000 mg | ORAL_TABLET | Freq: Three times a day (TID) | ORAL | 2 refills | Status: DC | PRN
  Filled 2021-06-04: qty 180, 30d supply, fill #0
  Filled 2021-07-27: qty 180, 30d supply, fill #1
  Filled 2021-10-28: qty 180, 30d supply, fill #2

## 2021-06-08 ENCOUNTER — Other Ambulatory Visit (HOSPITAL_BASED_OUTPATIENT_CLINIC_OR_DEPARTMENT_OTHER): Payer: Self-pay

## 2021-06-09 ENCOUNTER — Other Ambulatory Visit (HOSPITAL_BASED_OUTPATIENT_CLINIC_OR_DEPARTMENT_OTHER): Payer: Self-pay

## 2021-06-29 ENCOUNTER — Other Ambulatory Visit (HOSPITAL_BASED_OUTPATIENT_CLINIC_OR_DEPARTMENT_OTHER): Payer: Self-pay

## 2021-07-27 ENCOUNTER — Other Ambulatory Visit (HOSPITAL_BASED_OUTPATIENT_CLINIC_OR_DEPARTMENT_OTHER): Payer: Self-pay

## 2021-08-04 ENCOUNTER — Other Ambulatory Visit (HOSPITAL_BASED_OUTPATIENT_CLINIC_OR_DEPARTMENT_OTHER): Payer: Self-pay

## 2021-08-17 ENCOUNTER — Other Ambulatory Visit (HOSPITAL_BASED_OUTPATIENT_CLINIC_OR_DEPARTMENT_OTHER): Payer: Self-pay

## 2021-08-18 ENCOUNTER — Other Ambulatory Visit (HOSPITAL_BASED_OUTPATIENT_CLINIC_OR_DEPARTMENT_OTHER): Payer: Self-pay

## 2021-08-18 MED ORDER — EZETIMIBE 10 MG PO TABS
10.0000 mg | ORAL_TABLET | Freq: Every day | ORAL | 5 refills | Status: DC
  Filled 2021-08-18: qty 30, 30d supply, fill #0
  Filled 2021-09-14: qty 30, 30d supply, fill #1
  Filled 2021-10-12: qty 30, 30d supply, fill #2
  Filled 2021-11-11: qty 30, 30d supply, fill #3
  Filled 2021-12-08: qty 30, 30d supply, fill #4
  Filled 2022-01-08: qty 30, 30d supply, fill #5

## 2021-08-18 MED ORDER — FENOFIBRATE 160 MG PO TABS
160.0000 mg | ORAL_TABLET | Freq: Every day | ORAL | 1 refills | Status: DC
  Filled 2021-08-18: qty 90, 90d supply, fill #0
  Filled 2021-11-11: qty 90, 90d supply, fill #1

## 2021-08-27 ENCOUNTER — Other Ambulatory Visit (HOSPITAL_BASED_OUTPATIENT_CLINIC_OR_DEPARTMENT_OTHER): Payer: Self-pay

## 2021-08-27 DIAGNOSIS — M549 Dorsalgia, unspecified: Secondary | ICD-10-CM | POA: Diagnosis not present

## 2021-08-27 DIAGNOSIS — M542 Cervicalgia: Secondary | ICD-10-CM | POA: Diagnosis not present

## 2021-08-27 DIAGNOSIS — M961 Postlaminectomy syndrome, not elsewhere classified: Secondary | ICD-10-CM | POA: Diagnosis not present

## 2021-08-27 DIAGNOSIS — M5416 Radiculopathy, lumbar region: Secondary | ICD-10-CM | POA: Diagnosis not present

## 2021-08-27 DIAGNOSIS — Z9889 Other specified postprocedural states: Secondary | ICD-10-CM | POA: Diagnosis not present

## 2021-08-27 DIAGNOSIS — M533 Sacrococcygeal disorders, not elsewhere classified: Secondary | ICD-10-CM | POA: Diagnosis not present

## 2021-08-27 DIAGNOSIS — Z5181 Encounter for therapeutic drug level monitoring: Secondary | ICD-10-CM | POA: Diagnosis not present

## 2021-08-27 DIAGNOSIS — Z79899 Other long term (current) drug therapy: Secondary | ICD-10-CM | POA: Diagnosis not present

## 2021-08-27 MED ORDER — HYDROCODONE-ACETAMINOPHEN 5-325 MG PO TABS
ORAL_TABLET | ORAL | 0 refills | Status: DC
  Filled 2021-08-27: qty 15, 30d supply, fill #0

## 2021-08-27 MED ORDER — GABAPENTIN 300 MG PO CAPS
ORAL_CAPSULE | ORAL | 0 refills | Status: DC
  Filled 2021-08-27: qty 90, 30d supply, fill #0

## 2021-08-27 MED ORDER — TIZANIDINE HCL 4 MG PO TABS
ORAL_TABLET | ORAL | 2 refills | Status: DC
  Filled 2021-08-27: qty 90, 30d supply, fill #0
  Filled 2021-12-08: qty 90, 30d supply, fill #1

## 2021-08-27 MED ORDER — TRAMADOL HCL 50 MG PO TABS
ORAL_TABLET | ORAL | 1 refills | Status: DC
  Filled 2021-09-14: qty 180, 30d supply, fill #0
  Filled 2021-11-02: qty 180, 30d supply, fill #1

## 2021-09-11 ENCOUNTER — Other Ambulatory Visit (HOSPITAL_BASED_OUTPATIENT_CLINIC_OR_DEPARTMENT_OTHER): Payer: Self-pay

## 2021-09-11 DIAGNOSIS — E782 Mixed hyperlipidemia: Secondary | ICD-10-CM | POA: Diagnosis not present

## 2021-09-11 DIAGNOSIS — I10 Essential (primary) hypertension: Secondary | ICD-10-CM | POA: Diagnosis not present

## 2021-09-11 DIAGNOSIS — Z6841 Body Mass Index (BMI) 40.0 and over, adult: Secondary | ICD-10-CM | POA: Diagnosis not present

## 2021-09-11 MED ORDER — MOUNJARO 2.5 MG/0.5ML ~~LOC~~ SOAJ
SUBCUTANEOUS | 1 refills | Status: DC
  Filled 2021-09-11: qty 6, 84d supply, fill #0

## 2021-09-11 MED ORDER — HYDROCHLOROTHIAZIDE 12.5 MG PO CAPS
12.5000 mg | ORAL_CAPSULE | Freq: Every day | ORAL | 3 refills | Status: DC
  Filled 2021-09-11: qty 90, 90d supply, fill #0
  Filled 2021-12-08: qty 90, 90d supply, fill #1

## 2021-09-14 ENCOUNTER — Other Ambulatory Visit (HOSPITAL_BASED_OUTPATIENT_CLINIC_OR_DEPARTMENT_OTHER): Payer: Self-pay

## 2021-09-16 ENCOUNTER — Other Ambulatory Visit (HOSPITAL_BASED_OUTPATIENT_CLINIC_OR_DEPARTMENT_OTHER): Payer: Self-pay

## 2021-09-16 MED ORDER — WEGOVY 0.25 MG/0.5ML ~~LOC~~ SOAJ
SUBCUTANEOUS | 0 refills | Status: DC
  Filled 2021-09-16: qty 2, 28d supply, fill #0
  Filled 2021-10-11: qty 2, 28d supply, fill #1
  Filled 2021-11-16: qty 2, 28d supply, fill #2
  Filled ????-??-??: fill #3

## 2021-09-17 ENCOUNTER — Other Ambulatory Visit (HOSPITAL_BASED_OUTPATIENT_CLINIC_OR_DEPARTMENT_OTHER): Payer: Self-pay

## 2021-09-18 ENCOUNTER — Other Ambulatory Visit (HOSPITAL_BASED_OUTPATIENT_CLINIC_OR_DEPARTMENT_OTHER): Payer: Self-pay

## 2021-09-20 ENCOUNTER — Encounter: Payer: Self-pay | Admitting: Emergency Medicine

## 2021-09-20 ENCOUNTER — Other Ambulatory Visit: Payer: Self-pay

## 2021-09-20 ENCOUNTER — Emergency Department
Admission: EM | Admit: 2021-09-20 | Discharge: 2021-09-20 | Disposition: A | Discharge status: Home / Self Care | Payer: 59 | Source: Home / Self Care

## 2021-09-20 ENCOUNTER — Emergency Department: Admit: 2021-09-20 | Payer: Self-pay

## 2021-09-20 ENCOUNTER — Telehealth: Payer: Self-pay | Admitting: Emergency Medicine

## 2021-09-20 DIAGNOSIS — S0502XA Injury of conjunctiva and corneal abrasion without foreign body, left eye, initial encounter: Secondary | ICD-10-CM | POA: Diagnosis not present

## 2021-09-20 MED ORDER — ERYTHROMYCIN 5 MG/GM OP OINT
TOPICAL_OINTMENT | OPHTHALMIC | 0 refills | Status: DC
Start: 2021-09-20 — End: 2022-01-29

## 2021-09-20 MED ORDER — KETOROLAC TROMETHAMINE 0.5 % OP SOLN: 1.0000 [drp] | Freq: Four times a day (QID) | OPHTHALMIC | 0 refills | Status: AC
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
## 2021-09-20 NOTE — Discharge Instructions (Addendum)
Instructed patient to instill Erythromycin ophthalmic ointment as directed.  Advised patient may use Acular daily as needed for left eye pain between instillation of erythromycin.  Advised patient if symptoms worsen and/or unresolved please follow-up with ophthalmology/optometry for further evaluation.
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
## 2021-09-24 DIAGNOSIS — Z3189 Encounter for other procreative management: Secondary | ICD-10-CM | POA: Diagnosis not present

## 2021-10-05 ENCOUNTER — Other Ambulatory Visit (HOSPITAL_BASED_OUTPATIENT_CLINIC_OR_DEPARTMENT_OTHER): Payer: Self-pay

## 2021-10-12 ENCOUNTER — Other Ambulatory Visit (HOSPITAL_BASED_OUTPATIENT_CLINIC_OR_DEPARTMENT_OTHER): Payer: Self-pay

## 2021-10-13 ENCOUNTER — Other Ambulatory Visit (HOSPITAL_BASED_OUTPATIENT_CLINIC_OR_DEPARTMENT_OTHER): Payer: Self-pay

## 2021-10-23 ENCOUNTER — Other Ambulatory Visit (HOSPITAL_BASED_OUTPATIENT_CLINIC_OR_DEPARTMENT_OTHER): Payer: Self-pay

## 2021-10-27 ENCOUNTER — Other Ambulatory Visit (HOSPITAL_BASED_OUTPATIENT_CLINIC_OR_DEPARTMENT_OTHER): Payer: Self-pay

## 2021-10-28 ENCOUNTER — Other Ambulatory Visit (HOSPITAL_BASED_OUTPATIENT_CLINIC_OR_DEPARTMENT_OTHER): Payer: Self-pay

## 2021-11-02 ENCOUNTER — Other Ambulatory Visit (HOSPITAL_BASED_OUTPATIENT_CLINIC_OR_DEPARTMENT_OTHER): Payer: Self-pay

## 2021-11-09 ENCOUNTER — Other Ambulatory Visit (HOSPITAL_BASED_OUTPATIENT_CLINIC_OR_DEPARTMENT_OTHER): Payer: Self-pay

## 2021-11-11 ENCOUNTER — Other Ambulatory Visit (HOSPITAL_COMMUNITY): Payer: Self-pay

## 2021-11-12 ENCOUNTER — Other Ambulatory Visit (HOSPITAL_BASED_OUTPATIENT_CLINIC_OR_DEPARTMENT_OTHER): Payer: Self-pay

## 2021-11-12 DIAGNOSIS — F902 Attention-deficit hyperactivity disorder, combined type: Secondary | ICD-10-CM | POA: Diagnosis not present

## 2021-11-12 DIAGNOSIS — F3342 Major depressive disorder, recurrent, in full remission: Secondary | ICD-10-CM | POA: Diagnosis not present

## 2021-11-12 MED ORDER — BUPROPION HCL ER (XL) 300 MG PO TB24
ORAL_TABLET | ORAL | 1 refills | Status: DC
  Filled 2021-11-12 – 2021-11-13 (×2): qty 90, 90d supply, fill #0
  Filled 2022-02-07: qty 90, 90d supply, fill #1

## 2021-11-13 ENCOUNTER — Encounter (HOSPITAL_COMMUNITY): Payer: Self-pay

## 2021-11-13 ENCOUNTER — Other Ambulatory Visit (HOSPITAL_BASED_OUTPATIENT_CLINIC_OR_DEPARTMENT_OTHER): Payer: Self-pay

## 2021-11-13 ENCOUNTER — Other Ambulatory Visit (HOSPITAL_COMMUNITY): Payer: Self-pay

## 2021-11-16 ENCOUNTER — Other Ambulatory Visit (HOSPITAL_BASED_OUTPATIENT_CLINIC_OR_DEPARTMENT_OTHER): Payer: Self-pay

## 2021-11-16 MED ORDER — METHYLPHENIDATE HCL ER (OSM) 36 MG PO TBCR
36.0000 mg | EXTENDED_RELEASE_TABLET | Freq: Every morning | ORAL | 0 refills | Status: DC
  Filled 2022-01-01: qty 30, 30d supply, fill #0

## 2021-11-16 MED ORDER — METHYLPHENIDATE HCL ER (OSM) 36 MG PO TBCR
36.0000 mg | EXTENDED_RELEASE_TABLET | Freq: Every morning | ORAL | 0 refills | Status: DC
  Filled 2021-11-16: qty 30, 30d supply, fill #0

## 2021-11-16 MED ORDER — METHYLPHENIDATE HCL ER (OSM) 36 MG PO TBCR
36.0000 mg | EXTENDED_RELEASE_TABLET | Freq: Every morning | ORAL | 0 refills | Status: DC
  Filled 2022-01-01 – 2022-02-09 (×3): qty 30, 30d supply, fill #0

## 2021-11-19 ENCOUNTER — Other Ambulatory Visit (HOSPITAL_BASED_OUTPATIENT_CLINIC_OR_DEPARTMENT_OTHER): Payer: Self-pay

## 2021-11-19 DIAGNOSIS — M549 Dorsalgia, unspecified: Secondary | ICD-10-CM | POA: Diagnosis not present

## 2021-11-19 DIAGNOSIS — M533 Sacrococcygeal disorders, not elsewhere classified: Secondary | ICD-10-CM | POA: Diagnosis not present

## 2021-11-19 DIAGNOSIS — Z9889 Other specified postprocedural states: Secondary | ICD-10-CM | POA: Diagnosis not present

## 2021-11-19 DIAGNOSIS — M5417 Radiculopathy, lumbosacral region: Secondary | ICD-10-CM | POA: Diagnosis not present

## 2021-11-19 DIAGNOSIS — M961 Postlaminectomy syndrome, not elsewhere classified: Secondary | ICD-10-CM | POA: Diagnosis not present

## 2021-11-19 DIAGNOSIS — Z981 Arthrodesis status: Secondary | ICD-10-CM | POA: Diagnosis not present

## 2021-11-19 DIAGNOSIS — M542 Cervicalgia: Secondary | ICD-10-CM | POA: Diagnosis not present

## 2021-11-19 MED ORDER — GABAPENTIN 300 MG PO CAPS
ORAL_CAPSULE | ORAL | 1 refills | Status: DC
  Filled 2021-11-19 – 2022-01-05 (×3): qty 270, 90d supply, fill #0

## 2021-11-19 MED ORDER — HYDROCODONE-ACETAMINOPHEN 5-325 MG PO TABS
ORAL_TABLET | ORAL | 0 refills | Status: DC
  Filled 2021-11-19: qty 15, 15d supply, fill #0

## 2021-11-19 MED ORDER — TRAMADOL HCL 50 MG PO TABS
ORAL_TABLET | ORAL | 2 refills | Status: DC
  Filled 2021-11-19 – 2021-12-21 (×3): qty 180, 30d supply, fill #0

## 2021-11-19 MED ORDER — TIZANIDINE HCL 4 MG PO TABS
ORAL_TABLET | ORAL | 2 refills | Status: DC
  Filled 2021-11-19 – 2022-02-09 (×2): qty 90, 30d supply, fill #0
  Filled 2022-03-29: qty 90, 30d supply, fill #1

## 2021-11-26 ENCOUNTER — Other Ambulatory Visit (HOSPITAL_BASED_OUTPATIENT_CLINIC_OR_DEPARTMENT_OTHER): Payer: Self-pay

## 2021-11-26 DIAGNOSIS — Z981 Arthrodesis status: Secondary | ICD-10-CM | POA: Diagnosis not present

## 2021-12-04 ENCOUNTER — Inpatient Hospital Stay: Payer: 59 | Attending: Oncology | Admitting: Oncology

## 2021-12-04 ENCOUNTER — Telehealth: Payer: Self-pay | Admitting: Oncology

## 2021-12-04 ENCOUNTER — Encounter: Payer: Self-pay | Admitting: Oncology

## 2021-12-04 ENCOUNTER — Inpatient Hospital Stay: Payer: 59

## 2021-12-04 VITALS — BP 112/70 | HR 100 | Temp 98.1°F | Resp 18 | Ht 70.0 in | Wt 366.0 lb

## 2021-12-04 DIAGNOSIS — G8929 Other chronic pain: Secondary | ICD-10-CM | POA: Insufficient documentation

## 2021-12-04 DIAGNOSIS — N179 Acute kidney failure, unspecified: Secondary | ICD-10-CM | POA: Diagnosis not present

## 2021-12-04 DIAGNOSIS — R55 Syncope and collapse: Secondary | ICD-10-CM | POA: Diagnosis not present

## 2021-12-04 DIAGNOSIS — I959 Hypotension, unspecified: Secondary | ICD-10-CM | POA: Diagnosis not present

## 2021-12-04 DIAGNOSIS — D649 Anemia, unspecified: Secondary | ICD-10-CM | POA: Insufficient documentation

## 2021-12-04 DIAGNOSIS — L0501 Pilonidal cyst with abscess: Secondary | ICD-10-CM | POA: Insufficient documentation

## 2021-12-04 DIAGNOSIS — M4316 Spondylolisthesis, lumbar region: Secondary | ICD-10-CM | POA: Diagnosis not present

## 2021-12-04 LAB — CBC WITH DIFFERENTIAL (CANCER CENTER ONLY)
Abs Immature Granulocytes: 0.04 10*3/uL (ref 0.00–0.07)
Basophils Absolute: 0 10*3/uL (ref 0.0–0.1)
Basophils Relative: 1 %
Eosinophils Absolute: 0.2 10*3/uL (ref 0.0–0.5)
Eosinophils Relative: 3 %
HCT: 35.8 % — ABNORMAL LOW (ref 39.0–52.0)
Hemoglobin: 12.5 g/dL — ABNORMAL LOW (ref 13.0–17.0)
Immature Granulocytes: 1 %
Lymphocytes Relative: 28 %
Lymphs Abs: 1.9 10*3/uL (ref 0.7–4.0)
MCH: 30.2 pg (ref 26.0–34.0)
MCHC: 34.9 g/dL (ref 30.0–36.0)
MCV: 86.5 fL (ref 80.0–100.0)
Monocytes Absolute: 0.4 10*3/uL (ref 0.1–1.0)
Monocytes Relative: 5 %
Neutro Abs: 4.1 10*3/uL (ref 1.7–7.7)
Neutrophils Relative %: 62 %
Platelet Count: 278 10*3/uL (ref 150–400)
RBC: 4.14 MIL/uL — ABNORMAL LOW (ref 4.22–5.81)
RDW: 12.2 % (ref 11.5–15.5)
WBC Count: 6.5 10*3/uL (ref 4.0–10.5)
nRBC: 0 % (ref 0.0–0.2)

## 2021-12-04 NOTE — Telephone Encounter (Signed)
Per Dr. Kalman Drape Los patient was to return in three months for lab work. Patient advised that he will have those labs drawn at his clinic.

## 2021-12-04 NOTE — Progress Notes (Signed)
  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  He feels well.  Good appetite.  No bleeding.  No fever.  He had sweats when he had a recent "cold ".  No other night sweats.  The fissure has healed.  No difficulty with bowel function.  No nausea or abdominal pain.  Objective:  Vital signs in last 24 hours:  Blood pressure 112/70, pulse 100, temperature 98.1 F (36.7 C), temperature source Oral, resp. rate 18, height $RemoveBe'5\' 10"'DniaURdXR$  (1.778 m), weight (!) 366 lb (166 kg), SpO2 98 %.    Lymphatics: No cervical, supraclavicular, axillary, or inguinal nodes Resp: Lungs clear bilaterally Cardio: Regular rate and rhythm GI: No hepatosplenomegaly Vascular: No leg edema   Lab Results:  Lab Results  Component Value Date   WBC 6.5 12/04/2021   HGB 12.5 (L) 12/04/2021   HCT 35.8 (L) 12/04/2021   MCV 86.5 12/04/2021   PLT 278 12/04/2021   NEUTROABS 4.1 12/04/2021    CMP  Lab Results  Component Value Date   NA 142 02/10/2021   K 4.0 02/10/2021   CL 108 02/10/2021   CO2 25 02/10/2021   GLUCOSE 162 (H) 02/10/2021   BUN 20 02/10/2021   CREATININE 0.74 02/10/2021   CALCIUM 9.6 02/10/2021   PROT 6.8 01/06/2021   ALBUMIN 3.6 01/06/2021   AST 30 01/06/2021   ALT 45 (H) 01/06/2021   ALKPHOS 13 (L) 01/06/2021   BILITOT 0.6 01/06/2021   GFRNONAA >60 02/10/2021    Medications: I have reviewed the patient's current medications.   Assessment/Plan: Normocytic anemia 1 unit of packed red blood cells 01/05/2021 Bone marrow biopsy 01/08/2021-normocellular bone marrow with trilineage hematopoiesis.  Mild nonspecific changes involving myeloid cell lines with no increase in blasts.  No morphologic evidence of a lymphoproliferative process.  Storage iron is increased. Colonoscopy 02/04/2021-hemorrhoids found on perianal exam.  Entire examined colon normal.  No specimens collected.   2.  ARF 3.  Syncope/hypotension 4.   Pilonidal abscess 5.  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  He has mild normocytic anemia today.  The etiology of the mild anemia is unclear.  The hemoglobin remains much improved compared to when I saw him last year.  He underwent an extensive negative diagnostic evaluation in 2022.  Review of the outside medical record reveals intermittent mild anemia.  We will continue observation.  He will return for a CBC in 3 months and an office visit in 6 months.  He will call bleeding or  symptoms of anemia.  Betsy Coder, MD  12/04/2021  8:41 AM

## 2021-12-09 ENCOUNTER — Other Ambulatory Visit (HOSPITAL_COMMUNITY): Payer: Self-pay

## 2021-12-11 ENCOUNTER — Other Ambulatory Visit (HOSPITAL_BASED_OUTPATIENT_CLINIC_OR_DEPARTMENT_OTHER): Payer: Self-pay

## 2021-12-11 ENCOUNTER — Encounter (HOSPITAL_BASED_OUTPATIENT_CLINIC_OR_DEPARTMENT_OTHER): Payer: Self-pay

## 2021-12-11 DIAGNOSIS — Z6841 Body Mass Index (BMI) 40.0 and over, adult: Secondary | ICD-10-CM | POA: Diagnosis not present

## 2021-12-11 DIAGNOSIS — K760 Fatty (change of) liver, not elsewhere classified: Secondary | ICD-10-CM | POA: Diagnosis not present

## 2021-12-11 DIAGNOSIS — E782 Mixed hyperlipidemia: Secondary | ICD-10-CM | POA: Diagnosis not present

## 2021-12-11 DIAGNOSIS — I10 Essential (primary) hypertension: Secondary | ICD-10-CM | POA: Diagnosis not present

## 2021-12-11 MED ORDER — LISINOPRIL 40 MG PO TABS
40.0000 mg | ORAL_TABLET | Freq: Every day | ORAL | 3 refills | Status: DC
  Filled 2021-12-11 – 2021-12-16 (×2): qty 90, 90d supply, fill #0
  Filled 2022-01-10 – 2022-03-29 (×4): qty 90, 90d supply, fill #1

## 2021-12-11 MED ORDER — EZETIMIBE 10 MG PO TABS
10.0000 mg | ORAL_TABLET | Freq: Every day | ORAL | 3 refills | Status: DC
Start: 2021-12-11 — End: 2023-02-09
  Filled 2021-12-11 – 2022-02-07 (×2): qty 90, 90d supply, fill #0
  Filled 2022-05-17: qty 90, 90d supply, fill #1
  Filled 2022-08-11: qty 90, 90d supply, fill #2
  Filled 2022-11-07: qty 90, 90d supply, fill #3

## 2021-12-11 MED ORDER — WEGOVY 0.5 MG/0.5ML ~~LOC~~ SOAJ
0.5000 mg | SUBCUTANEOUS | 5 refills | Status: AC
  Filled 2021-12-11 – 2021-12-16 (×2): qty 2, 28d supply, fill #0
  Filled 2022-03-14: qty 2, 28d supply, fill #1
  Filled 2022-04-09 (×2): qty 2, 28d supply, fill #2
  Filled 2022-06-19: qty 2, 28d supply, fill #3

## 2021-12-14 DIAGNOSIS — N179 Acute kidney failure, unspecified: Secondary | ICD-10-CM | POA: Diagnosis not present

## 2021-12-14 DIAGNOSIS — M545 Low back pain, unspecified: Secondary | ICD-10-CM | POA: Diagnosis not present

## 2021-12-14 DIAGNOSIS — I959 Hypotension, unspecified: Secondary | ICD-10-CM | POA: Diagnosis not present

## 2021-12-14 DIAGNOSIS — I129 Hypertensive chronic kidney disease with stage 1 through stage 4 chronic kidney disease, or unspecified chronic kidney disease: Secondary | ICD-10-CM | POA: Diagnosis not present

## 2021-12-14 DIAGNOSIS — N183 Chronic kidney disease, stage 3 unspecified: Secondary | ICD-10-CM | POA: Diagnosis not present

## 2021-12-14 DIAGNOSIS — M79606 Pain in leg, unspecified: Secondary | ICD-10-CM | POA: Diagnosis not present

## 2021-12-14 DIAGNOSIS — N200 Calculus of kidney: Secondary | ICD-10-CM | POA: Diagnosis not present

## 2021-12-14 DIAGNOSIS — Z981 Arthrodesis status: Secondary | ICD-10-CM | POA: Diagnosis not present

## 2021-12-14 DIAGNOSIS — G8929 Other chronic pain: Secondary | ICD-10-CM | POA: Diagnosis not present

## 2021-12-15 ENCOUNTER — Other Ambulatory Visit (HOSPITAL_BASED_OUTPATIENT_CLINIC_OR_DEPARTMENT_OTHER): Payer: Self-pay

## 2021-12-15 DIAGNOSIS — E785 Hyperlipidemia, unspecified: Secondary | ICD-10-CM | POA: Diagnosis not present

## 2021-12-15 DIAGNOSIS — M545 Low back pain, unspecified: Secondary | ICD-10-CM | POA: Diagnosis not present

## 2021-12-15 DIAGNOSIS — N183 Chronic kidney disease, stage 3 unspecified: Secondary | ICD-10-CM | POA: Diagnosis not present

## 2021-12-15 DIAGNOSIS — F32A Depression, unspecified: Secondary | ICD-10-CM | POA: Diagnosis not present

## 2021-12-15 DIAGNOSIS — I10 Essential (primary) hypertension: Secondary | ICD-10-CM | POA: Diagnosis not present

## 2021-12-15 DIAGNOSIS — N200 Calculus of kidney: Secondary | ICD-10-CM | POA: Diagnosis not present

## 2021-12-15 DIAGNOSIS — G4733 Obstructive sleep apnea (adult) (pediatric): Secondary | ICD-10-CM | POA: Diagnosis not present

## 2021-12-15 DIAGNOSIS — I129 Hypertensive chronic kidney disease with stage 1 through stage 4 chronic kidney disease, or unspecified chronic kidney disease: Secondary | ICD-10-CM | POA: Diagnosis not present

## 2021-12-15 DIAGNOSIS — F988 Other specified behavioral and emotional disorders with onset usually occurring in childhood and adolescence: Secondary | ICD-10-CM | POA: Diagnosis not present

## 2021-12-15 DIAGNOSIS — M5489 Other dorsalgia: Secondary | ICD-10-CM | POA: Diagnosis not present

## 2021-12-15 DIAGNOSIS — K760 Fatty (change of) liver, not elsewhere classified: Secondary | ICD-10-CM | POA: Diagnosis not present

## 2021-12-15 DIAGNOSIS — Z981 Arthrodesis status: Secondary | ICD-10-CM | POA: Diagnosis not present

## 2021-12-15 DIAGNOSIS — D631 Anemia in chronic kidney disease: Secondary | ICD-10-CM | POA: Diagnosis not present

## 2021-12-15 DIAGNOSIS — M549 Dorsalgia, unspecified: Secondary | ICD-10-CM | POA: Diagnosis not present

## 2021-12-15 DIAGNOSIS — M79606 Pain in leg, unspecified: Secondary | ICD-10-CM | POA: Diagnosis not present

## 2021-12-15 DIAGNOSIS — I959 Hypotension, unspecified: Secondary | ICD-10-CM | POA: Diagnosis not present

## 2021-12-15 DIAGNOSIS — G8929 Other chronic pain: Secondary | ICD-10-CM | POA: Diagnosis not present

## 2021-12-15 DIAGNOSIS — N179 Acute kidney failure, unspecified: Secondary | ICD-10-CM | POA: Diagnosis not present

## 2021-12-16 ENCOUNTER — Other Ambulatory Visit (HOSPITAL_BASED_OUTPATIENT_CLINIC_OR_DEPARTMENT_OTHER): Payer: Self-pay

## 2021-12-16 DIAGNOSIS — E785 Hyperlipidemia, unspecified: Secondary | ICD-10-CM | POA: Diagnosis not present

## 2021-12-16 DIAGNOSIS — I129 Hypertensive chronic kidney disease with stage 1 through stage 4 chronic kidney disease, or unspecified chronic kidney disease: Secondary | ICD-10-CM | POA: Diagnosis not present

## 2021-12-16 DIAGNOSIS — G8929 Other chronic pain: Secondary | ICD-10-CM | POA: Diagnosis not present

## 2021-12-16 DIAGNOSIS — M545 Low back pain, unspecified: Secondary | ICD-10-CM | POA: Diagnosis not present

## 2021-12-16 DIAGNOSIS — M5489 Other dorsalgia: Secondary | ICD-10-CM | POA: Diagnosis not present

## 2021-12-16 DIAGNOSIS — F32A Depression, unspecified: Secondary | ICD-10-CM | POA: Diagnosis not present

## 2021-12-16 DIAGNOSIS — E669 Obesity, unspecified: Secondary | ICD-10-CM | POA: Diagnosis not present

## 2021-12-16 DIAGNOSIS — R7303 Prediabetes: Secondary | ICD-10-CM | POA: Diagnosis not present

## 2021-12-16 DIAGNOSIS — Z6841 Body Mass Index (BMI) 40.0 and over, adult: Secondary | ICD-10-CM | POA: Diagnosis not present

## 2021-12-16 DIAGNOSIS — I959 Hypotension, unspecified: Secondary | ICD-10-CM | POA: Diagnosis not present

## 2021-12-16 DIAGNOSIS — M79606 Pain in leg, unspecified: Secondary | ICD-10-CM | POA: Diagnosis not present

## 2021-12-16 DIAGNOSIS — G4733 Obstructive sleep apnea (adult) (pediatric): Secondary | ICD-10-CM | POA: Diagnosis not present

## 2021-12-16 DIAGNOSIS — N179 Acute kidney failure, unspecified: Secondary | ICD-10-CM | POA: Diagnosis not present

## 2021-12-16 DIAGNOSIS — N183 Chronic kidney disease, stage 3 unspecified: Secondary | ICD-10-CM | POA: Diagnosis not present

## 2021-12-16 DIAGNOSIS — D631 Anemia in chronic kidney disease: Secondary | ICD-10-CM | POA: Diagnosis not present

## 2021-12-16 DIAGNOSIS — I10 Essential (primary) hypertension: Secondary | ICD-10-CM | POA: Diagnosis not present

## 2021-12-16 DIAGNOSIS — Z981 Arthrodesis status: Secondary | ICD-10-CM | POA: Diagnosis not present

## 2021-12-16 DIAGNOSIS — N200 Calculus of kidney: Secondary | ICD-10-CM | POA: Diagnosis not present

## 2021-12-16 DIAGNOSIS — K760 Fatty (change of) liver, not elsewhere classified: Secondary | ICD-10-CM | POA: Diagnosis not present

## 2021-12-17 DIAGNOSIS — N179 Acute kidney failure, unspecified: Secondary | ICD-10-CM | POA: Diagnosis not present

## 2021-12-17 DIAGNOSIS — E785 Hyperlipidemia, unspecified: Secondary | ICD-10-CM | POA: Diagnosis not present

## 2021-12-17 DIAGNOSIS — M79606 Pain in leg, unspecified: Secondary | ICD-10-CM | POA: Diagnosis not present

## 2021-12-17 DIAGNOSIS — N200 Calculus of kidney: Secondary | ICD-10-CM | POA: Diagnosis not present

## 2021-12-17 DIAGNOSIS — K760 Fatty (change of) liver, not elsewhere classified: Secondary | ICD-10-CM | POA: Diagnosis not present

## 2021-12-17 DIAGNOSIS — M545 Low back pain, unspecified: Secondary | ICD-10-CM | POA: Diagnosis not present

## 2021-12-17 DIAGNOSIS — G8929 Other chronic pain: Secondary | ICD-10-CM | POA: Diagnosis not present

## 2021-12-17 DIAGNOSIS — R7303 Prediabetes: Secondary | ICD-10-CM | POA: Diagnosis not present

## 2021-12-17 DIAGNOSIS — I959 Hypotension, unspecified: Secondary | ICD-10-CM | POA: Diagnosis not present

## 2021-12-17 DIAGNOSIS — I10 Essential (primary) hypertension: Secondary | ICD-10-CM | POA: Diagnosis not present

## 2021-12-17 DIAGNOSIS — E669 Obesity, unspecified: Secondary | ICD-10-CM | POA: Diagnosis not present

## 2021-12-17 DIAGNOSIS — F32A Depression, unspecified: Secondary | ICD-10-CM | POA: Diagnosis not present

## 2021-12-17 DIAGNOSIS — Z981 Arthrodesis status: Secondary | ICD-10-CM | POA: Diagnosis not present

## 2021-12-17 DIAGNOSIS — I129 Hypertensive chronic kidney disease with stage 1 through stage 4 chronic kidney disease, or unspecified chronic kidney disease: Secondary | ICD-10-CM | POA: Diagnosis not present

## 2021-12-17 DIAGNOSIS — N183 Chronic kidney disease, stage 3 unspecified: Secondary | ICD-10-CM | POA: Diagnosis not present

## 2021-12-21 ENCOUNTER — Other Ambulatory Visit (HOSPITAL_BASED_OUTPATIENT_CLINIC_OR_DEPARTMENT_OTHER): Payer: Self-pay

## 2022-01-01 ENCOUNTER — Other Ambulatory Visit (HOSPITAL_BASED_OUTPATIENT_CLINIC_OR_DEPARTMENT_OTHER): Payer: Self-pay

## 2022-01-05 ENCOUNTER — Other Ambulatory Visit (HOSPITAL_BASED_OUTPATIENT_CLINIC_OR_DEPARTMENT_OTHER): Payer: Self-pay

## 2022-01-08 ENCOUNTER — Other Ambulatory Visit (HOSPITAL_COMMUNITY): Payer: Self-pay

## 2022-01-08 DIAGNOSIS — N200 Calculus of kidney: Secondary | ICD-10-CM | POA: Diagnosis not present

## 2022-01-11 ENCOUNTER — Other Ambulatory Visit (HOSPITAL_COMMUNITY): Payer: Self-pay

## 2022-01-12 ENCOUNTER — Other Ambulatory Visit: Payer: Self-pay | Admitting: Urology

## 2022-01-16 ENCOUNTER — Other Ambulatory Visit (HOSPITAL_COMMUNITY): Payer: Self-pay

## 2022-01-18 ENCOUNTER — Other Ambulatory Visit (HOSPITAL_BASED_OUTPATIENT_CLINIC_OR_DEPARTMENT_OTHER): Payer: Self-pay

## 2022-01-25 DIAGNOSIS — Z3141 Encounter for fertility testing: Secondary | ICD-10-CM | POA: Diagnosis not present

## 2022-01-27 ENCOUNTER — Other Ambulatory Visit (HOSPITAL_BASED_OUTPATIENT_CLINIC_OR_DEPARTMENT_OTHER): Payer: Self-pay

## 2022-01-29 ENCOUNTER — Encounter (HOSPITAL_BASED_OUTPATIENT_CLINIC_OR_DEPARTMENT_OTHER): Payer: Self-pay | Admitting: Urology
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
## 2022-02-01 ENCOUNTER — Other Ambulatory Visit (HOSPITAL_BASED_OUTPATIENT_CLINIC_OR_DEPARTMENT_OTHER): Payer: Self-pay

## 2022-02-03 ENCOUNTER — Encounter (HOSPITAL_BASED_OUTPATIENT_CLINIC_OR_DEPARTMENT_OTHER): Payer: Self-pay | Admitting: Anesthesiology

## 2022-02-04 ENCOUNTER — Encounter (HOSPITAL_BASED_OUTPATIENT_CLINIC_OR_DEPARTMENT_OTHER)
Admission: RE | Disposition: A | Discharge status: Home / Self Care | Payer: Self-pay | Source: Home / Self Care | Attending: Urology

## 2022-02-04 ENCOUNTER — Encounter (HOSPITAL_BASED_OUTPATIENT_CLINIC_OR_DEPARTMENT_OTHER): Payer: Self-pay | Admitting: Urology

## 2022-02-04 ENCOUNTER — Ambulatory Visit (HOSPITAL_COMMUNITY)
Admission: RE | Admit: 2022-02-04 | Discharge: 2022-02-04 | Disposition: A | Discharge status: Home / Self Care | Payer: 59 | Source: Home / Self Care | Attending: Urology | Admitting: Urology

## 2022-02-04 ENCOUNTER — Ambulatory Visit (HOSPITAL_COMMUNITY): Payer: 59

## 2022-02-04 ENCOUNTER — Ambulatory Visit (HOSPITAL_BASED_OUTPATIENT_CLINIC_OR_DEPARTMENT_OTHER)
Admission: RE | Admit: 2022-02-04 | Discharge: 2022-02-04 | Disposition: A | Discharge status: Home / Self Care | Payer: 59 | Attending: Urology | Admitting: Urology

## 2022-02-04 DIAGNOSIS — G4730 Sleep apnea, unspecified: Secondary | ICD-10-CM | POA: Diagnosis not present

## 2022-02-04 DIAGNOSIS — N200 Calculus of kidney: Secondary | ICD-10-CM | POA: Diagnosis not present

## 2022-02-04 DIAGNOSIS — Z981 Arthrodesis status: Secondary | ICD-10-CM | POA: Diagnosis not present

## 2022-02-04 HISTORY — PX: EXTRACORPOREAL SHOCK WAVE LITHOTRIPSY: SHX1557

## 2022-02-04 HISTORY — DX: Sleep apnea, unspecified: G47.30

## 2022-02-04 HISTORY — DX: Depression, unspecified: F32.A

## 2022-02-04 HISTORY — DX: Attention-deficit hyperactivity disorder, unspecified type: F90.9

## 2022-02-04 SURGERY — LITHOTRIPSY, ESWL
Anesthesia: LOCAL | Laterality: Left

## 2022-02-04 MED ORDER — SODIUM CHLORIDE 0.9 % IV SOLN: INTRAVENOUS | Status: DC

## 2022-02-04 MED ORDER — DIPHENHYDRAMINE HCL 25 MG PO CAPS
25.0000 mg | ORAL_CAPSULE | ORAL | Status: AC
  Administered 2022-02-04: 25 mg via ORAL

## 2022-02-04 MED ORDER — DOCUSATE SODIUM 100 MG PO CAPS: 100.0000 mg | ORAL_CAPSULE | Freq: Every day | ORAL | 2 refills | Status: DC | PRN

## 2022-02-04 MED ORDER — CIPROFLOXACIN HCL 500 MG PO TABS
500.0000 mg | ORAL_TABLET | ORAL | Status: AC
  Administered 2022-02-04: 500 mg via ORAL

## 2022-02-04 MED ORDER — OXYCODONE HCL 5 MG PO TABS: 5.0000 mg | ORAL_TABLET | ORAL | 0 refills | Status: DC | PRN

## 2022-02-04 MED ORDER — CIPROFLOXACIN HCL 500 MG PO TABS
ORAL_TABLET | ORAL | Status: AC
  Filled 2022-02-04: qty 1

## 2022-02-04 MED ORDER — DIAZEPAM 5 MG PO TABS
ORAL_TABLET | ORAL | Status: AC
  Filled 2022-02-04: qty 2

## 2022-02-04 MED ORDER — TAMSULOSIN HCL 0.4 MG PO CAPS: 0.4000 mg | ORAL_CAPSULE | Freq: Every day | ORAL | 1 refills | Status: AC

## 2022-02-04 MED ORDER — DIAZEPAM 5 MG PO TABS
10.0000 mg | ORAL_TABLET | ORAL | Status: AC
  Administered 2022-02-04: 10 mg via ORAL

## 2022-02-04 MED ORDER — DIPHENHYDRAMINE HCL 25 MG PO CAPS
ORAL_CAPSULE | ORAL | Status: AC
  Filled 2022-02-04: qty 1

## 2022-02-04 NOTE — Discharge Instructions (Addendum)
Activity:  You are encouraged to ambulate frequently (about every hour during waking hours) to help prevent blood clots from forming in your legs or lungs.    Diet: You should advance your diet as instructed by your physician.  It will be normal to have some bloating, nausea, and abdominal discomfort intermittently.  Prescriptions:  You will be provided a prescription for pain medication to take as needed.  If your pain is not severe enough to require the prescription pain medication, you may take extra strength Tylenol instead which will have less side effects.  You should also take a prescribed stool softener to avoid straining with bowel movements as the prescription pain medication may constipate you.  What to call us about: You should call the office ([phone]) if you develop fever > 101 or develop persistent vomiting. Activity:  You are encouraged to ambulate frequently (about every hour during waking hours) to help prevent blood clots from forming in your legs or lungs.          Post Anesthesia Home Care Instructions  Activity: Get plenty of rest for the remainder of the day. A responsible individual must stay with you for 24 hours following the procedure.  For the next 24 hours, DO NOT: -Drive a car -Operate machinery -Drink alcoholic beverages -Take any medication unless instructed by your physician -Make any legal decisions or sign important papers.  Meals: Start with liquid foods such as gelatin or soup. Progress to regular foods as tolerated. Avoid greasy, spicy, heavy foods. If nausea and/or vomiting occur, drink only clear liquids until the nausea and/or vomiting subsides. Call your physician if vomiting continues.  Special Instructions/Symptoms: Your throat may feel dry or sore from the anesthesia or the breathing tube placed in your throat during surgery. If this causes discomfort, gargle with warm salt water. The discomfort should disappear within 24 hours.       

## 2022-02-04 NOTE — H&P (Signed)
Urology Preoperative H&P   Chief Complaint: Left renal stone  History of Present Illness: Hunter [name] is a 36 y.o. male with a left renal stone here for left ESWL. Denies fevers, chills, dysuria.    Past Medical History:  Diagnosis Date   ADHD (attention deficit hyperactivity disorder)    Back disorder    L3 spondelothesis   Depression    Hypertension    Kidney stones    Sleep apnea     Past Surgical History:  Procedure Laterality Date   MOLE REMOVAL     ORIF RADIUS & ULNA FRACTURES     rods put in   RHINOPLASTY      Allergies: No Known Allergies  Family History  Problem Relation Age of Onset   Hypertension Mother    Cancer Mother        cancer    Social History:  reports that he has never smoked. He has never used smokeless tobacco. He reports that he does not currently use alcohol. He reports that he does not use drugs.  ROS: A complete review of systems was performed.  All systems are negative except for pertinent findings as noted.  Physical Exam:  Vital signs in last 24 hours: Temp:  [98.6 F (37 C)] 98.6 F (37 C) (10/05 1210) Pulse Rate:  [87] 87 (10/05 1210) Resp:  [18] 18 (10/05 1210) BP: (179)/(100) 179/100 (10/05 1210) SpO2:  [97 %] 97 % (10/05 1210) Weight:  [164.3 kg] 164.3 kg (10/05 1210) Constitutional:  Alert and oriented, No acute distress Cardiovascular: Regular rate and rhythm Respiratory: Normal respiratory effort, Lungs clear bilaterally GI: Abdomen is soft, nontender, nondistended, no abdominal masses GU: No CVA tenderness Lymphatic: No lymphadenopathy Neurologic: Grossly intact, no focal deficits Psychiatric: Normal mood and affect  Laboratory Data:  No results for input(s): "WBC", "HGB", "HCT", "PLT" in the last 72 hours.  No results for input(s): "NA", "K", "CL", "GLUCOSE", "BUN", "CALCIUM", "CREATININE" in the last 72 hours.  Invalid input(s): "CO3"   No results found for this or any previous visit (from the past 24  hour(s)). No results found for this or any previous visit (from the past 240 hour(s)).  Renal Function: No results for input(s): "CREATININE" in the last 168 hours. CrCl cannot be calculated (Patient's most recent lab result is older than the maximum 21 days allowed.).  Radiologic Imaging: No results found.  I independently reviewed the above imaging studies.  Assessment and Plan Hunter [name] is a 36 y.o. male with a left renal stone here for left ESWL.  The risks, benefits and alternatives of left ESWL was discussed with the patient. I described the risks which include arrhythmia, kidney contusion, kidney hemorrhage, need for transfusion, back discomfort, flank ecchymosis, flank abrasion, inability to fracture the stone, inability to pass stone fragments, [place], infection associated with obstructing stones, need for an alternative surgical procedure and possible need for repeat shockwave lithotripsy.  The patient voices understanding and wishes to proceed.        Matt R. Narelle Schoening MD 02/04/2022, 12:56 PM  [facility] Pager: [phone]): [phone]

## 2022-02-04 NOTE — Op Note (Signed)
[facility] Operative Note  Treating Physician: Quinteria Chisum, MD  Pre-op diagnosis: Left renal stone  Post-op diagnosis: Same   Procedure: Left ESWL  See [facility] OP note scanned into chart. Also because of the size, density, location and other factors that cannot be anticipated I feel this will likely be a staged procedure. This fact supersedes any indication in the scanned [facility] operative note to the contrary.  Matt R. Juergen Hardenbrook MD [facility]  Pager: [phone]   

## 2022-02-05 ENCOUNTER — Encounter (HOSPITAL_BASED_OUTPATIENT_CLINIC_OR_DEPARTMENT_OTHER): Payer: Self-pay | Admitting: Urology

## 2022-02-08 ENCOUNTER — Other Ambulatory Visit (HOSPITAL_COMMUNITY): Payer: Self-pay

## 2022-02-09 ENCOUNTER — Other Ambulatory Visit (HOSPITAL_BASED_OUTPATIENT_CLINIC_OR_DEPARTMENT_OTHER): Payer: Self-pay

## 2022-02-10 ENCOUNTER — Other Ambulatory Visit (HOSPITAL_BASED_OUTPATIENT_CLINIC_OR_DEPARTMENT_OTHER): Payer: Self-pay

## 2022-02-11 ENCOUNTER — Other Ambulatory Visit (HOSPITAL_BASED_OUTPATIENT_CLINIC_OR_DEPARTMENT_OTHER): Payer: Self-pay

## 2022-02-15 ENCOUNTER — Other Ambulatory Visit (HOSPITAL_BASED_OUTPATIENT_CLINIC_OR_DEPARTMENT_OTHER): Payer: Self-pay

## 2022-02-16 ENCOUNTER — Other Ambulatory Visit (HOSPITAL_BASED_OUTPATIENT_CLINIC_OR_DEPARTMENT_OTHER): Payer: Self-pay

## 2022-02-18 ENCOUNTER — Other Ambulatory Visit (HOSPITAL_BASED_OUTPATIENT_CLINIC_OR_DEPARTMENT_OTHER): Payer: Self-pay

## 2022-02-18 DIAGNOSIS — M4316 Spondylolisthesis, lumbar region: Secondary | ICD-10-CM | POA: Diagnosis not present

## 2022-02-18 DIAGNOSIS — Z5181 Encounter for therapeutic drug level monitoring: Secondary | ICD-10-CM | POA: Diagnosis not present

## 2022-02-18 DIAGNOSIS — M5416 Radiculopathy, lumbar region: Secondary | ICD-10-CM | POA: Diagnosis not present

## 2022-02-18 DIAGNOSIS — M961 Postlaminectomy syndrome, not elsewhere classified: Secondary | ICD-10-CM | POA: Diagnosis not present

## 2022-02-18 DIAGNOSIS — Z981 Arthrodesis status: Secondary | ICD-10-CM | POA: Diagnosis not present

## 2022-02-18 DIAGNOSIS — Z79899 Other long term (current) drug therapy: Secondary | ICD-10-CM | POA: Diagnosis not present

## 2022-02-18 MED ORDER — HYDROCODONE-ACETAMINOPHEN 5-325 MG PO TABS
1.0000 | ORAL_TABLET | Freq: Every evening | ORAL | 0 refills | Status: DC | PRN
  Filled 2022-02-18: qty 15, 15d supply, fill #0

## 2022-02-18 MED ORDER — TIZANIDINE HCL 4 MG PO TABS
4.0000 mg | ORAL_TABLET | Freq: Three times a day (TID) | ORAL | 2 refills | Status: AC | PRN
  Filled 2022-02-18: qty 90, 30d supply, fill #0

## 2022-02-18 MED ORDER — GABAPENTIN 300 MG PO CAPS
300.0000 mg | ORAL_CAPSULE | Freq: Three times a day (TID) | ORAL | 0 refills | Status: DC
  Filled 2022-02-18 – 2022-04-04 (×2): qty 270, 90d supply, fill #0

## 2022-02-19 DIAGNOSIS — N200 Calculus of kidney: Secondary | ICD-10-CM | POA: Diagnosis not present

## 2022-02-22 ENCOUNTER — Other Ambulatory Visit (HOSPITAL_BASED_OUTPATIENT_CLINIC_OR_DEPARTMENT_OTHER): Payer: Self-pay

## 2022-02-22 MED ORDER — TRAMADOL HCL 50 MG PO TABS
50.0000 mg | ORAL_TABLET | Freq: Three times a day (TID) | ORAL | 2 refills | Status: AC | PRN
  Filled 2022-02-22: qty 180, 30d supply, fill #0
  Filled 2022-03-29: qty 180, 30d supply, fill #1

## 2022-02-23 ENCOUNTER — Other Ambulatory Visit (HOSPITAL_BASED_OUTPATIENT_CLINIC_OR_DEPARTMENT_OTHER): Payer: Self-pay

## 2022-03-09 ENCOUNTER — Other Ambulatory Visit (HOSPITAL_BASED_OUTPATIENT_CLINIC_OR_DEPARTMENT_OTHER): Payer: Self-pay

## 2022-03-09 MED ORDER — FENOFIBRATE 160 MG PO TABS
160.0000 mg | ORAL_TABLET | Freq: Every day | ORAL | 3 refills | Status: DC
  Filled 2022-03-09: qty 90, 90d supply, fill #0
  Filled 2022-06-07: qty 90, 90d supply, fill #1
  Filled 2022-09-03: qty 90, 90d supply, fill #2
  Filled 2022-12-10: qty 90, 90d supply, fill #3

## 2022-03-12 ENCOUNTER — Other Ambulatory Visit (HOSPITAL_BASED_OUTPATIENT_CLINIC_OR_DEPARTMENT_OTHER): Payer: Self-pay

## 2022-03-13 DIAGNOSIS — F902 Attention-deficit hyperactivity disorder, combined type: Secondary | ICD-10-CM | POA: Diagnosis not present

## 2022-03-13 DIAGNOSIS — F3342 Major depressive disorder, recurrent, in full remission: Secondary | ICD-10-CM | POA: Diagnosis not present

## 2022-03-15 ENCOUNTER — Other Ambulatory Visit (HOSPITAL_BASED_OUTPATIENT_CLINIC_OR_DEPARTMENT_OTHER): Payer: Self-pay

## 2022-03-15 MED ORDER — METHYLPHENIDATE HCL ER (OSM) 36 MG PO TBCR
36.0000 mg | EXTENDED_RELEASE_TABLET | Freq: Every morning | ORAL | 0 refills | Status: DC
  Filled 2022-04-27: qty 30, 30d supply, fill #0

## 2022-03-15 MED ORDER — METHYLPHENIDATE HCL ER (OSM) 36 MG PO TBCR: 36.0000 mg | EXTENDED_RELEASE_TABLET | Freq: Every morning | ORAL | 0 refills | Status: DC

## 2022-03-15 MED ORDER — METHYLPHENIDATE HCL ER (OSM) 36 MG PO TBCR
36.0000 mg | EXTENDED_RELEASE_TABLET | Freq: Every morning | ORAL | 0 refills | Status: DC
  Filled 2022-03-15: qty 30, 30d supply, fill #0

## 2022-03-15 MED ORDER — BUPROPION HCL ER (XL) 300 MG PO TB24: 300.0000 mg | ORAL_TABLET | Freq: Every morning | ORAL | 1 refills | Status: DC

## 2022-03-15 MED ORDER — BUPROPION HCL ER (XL) 300 MG PO TB24
300.0000 mg | ORAL_TABLET | Freq: Every morning | ORAL | 1 refills | Status: AC
  Filled 2022-05-27: qty 90, 90d supply, fill #0
  Filled 2022-09-03 – 2023-02-09 (×2): qty 90, 90d supply, fill #1

## 2022-03-17 DIAGNOSIS — N200 Calculus of kidney: Secondary | ICD-10-CM | POA: Diagnosis not present

## 2022-03-17 DIAGNOSIS — R8271 Bacteriuria: Secondary | ICD-10-CM | POA: Diagnosis not present

## 2022-03-22 ENCOUNTER — Other Ambulatory Visit: Payer: Self-pay | Admitting: Urology

## 2022-03-23 ENCOUNTER — Other Ambulatory Visit (HOSPITAL_BASED_OUTPATIENT_CLINIC_OR_DEPARTMENT_OTHER): Payer: Self-pay

## 2022-03-23 DIAGNOSIS — I10 Essential (primary) hypertension: Secondary | ICD-10-CM | POA: Diagnosis not present

## 2022-03-23 DIAGNOSIS — N1831 Chronic kidney disease, stage 3a: Secondary | ICD-10-CM | POA: Diagnosis not present

## 2022-03-23 DIAGNOSIS — E782 Mixed hyperlipidemia: Secondary | ICD-10-CM | POA: Diagnosis not present

## 2022-03-23 MED ORDER — BISOPROLOL FUMARATE 5 MG PO TABS
5.0000 mg | ORAL_TABLET | Freq: Every day | ORAL | 3 refills | Status: DC
  Filled 2022-03-23: qty 55, 55d supply, fill #0
  Filled 2022-03-26: qty 80, 80d supply, fill #0
  Filled 2022-03-26: qty 10, 10d supply, fill #0
  Filled 2022-06-20: qty 90, 90d supply, fill #1
  Filled 2022-09-26: qty 90, 90d supply, fill #2
  Filled 2023-01-02: qty 90, 90d supply, fill #3

## 2022-03-24 ENCOUNTER — Other Ambulatory Visit (HOSPITAL_BASED_OUTPATIENT_CLINIC_OR_DEPARTMENT_OTHER): Payer: Self-pay

## 2022-03-26 ENCOUNTER — Other Ambulatory Visit (HOSPITAL_BASED_OUTPATIENT_CLINIC_OR_DEPARTMENT_OTHER): Payer: Self-pay

## 2022-03-29 ENCOUNTER — Other Ambulatory Visit (HOSPITAL_BASED_OUTPATIENT_CLINIC_OR_DEPARTMENT_OTHER): Payer: Self-pay

## 2022-04-04 ENCOUNTER — Other Ambulatory Visit (HOSPITAL_COMMUNITY): Payer: Self-pay

## 2022-04-05 ENCOUNTER — Other Ambulatory Visit (HOSPITAL_COMMUNITY): Payer: Self-pay

## 2022-04-09 ENCOUNTER — Other Ambulatory Visit (HOSPITAL_BASED_OUTPATIENT_CLINIC_OR_DEPARTMENT_OTHER): Payer: Self-pay

## 2022-04-15 ENCOUNTER — Encounter (HOSPITAL_BASED_OUTPATIENT_CLINIC_OR_DEPARTMENT_OTHER): Payer: Self-pay | Admitting: Urology

## 2022-04-15 NOTE — Progress Notes (Signed)
Spoke w/ via phone for pre-op interview--- pt Lab needs dos----  [employer], ekg             Lab results------ no COVID test -----patient states asymptomatic no test needed Arrive at -------  0530 on 04-19-2022 NPO after MN NO Solid Food.  Clear liquids from MN until--- 0430 Med rec completed Medications to take morning of surgery ----- bisoprolol, gabapentin, zanaflex, fenofibrate, zetia, wellbutrin Diabetic medication ----- n/a Patient instructed no nail polish to be worn day of surgery Patient instructed to bring photo id and insurance card day of surgery Patient aware to have Driver (ride ) / caregiver    for 24 hours after surgery -- father, mark Patient Special Instructions ----- pt aware and will not do wegovy Saturday prior to surgery, so last dose was 04-10-2022 Pre-Op special Istructions ----- n/a Patient verbalized understanding of instructions that were given at this phone interview. Patient denies shortness of breath, chest pain, fever, cough at this phone interview.

## 2022-04-19 ENCOUNTER — Ambulatory Visit (HOSPITAL_BASED_OUTPATIENT_CLINIC_OR_DEPARTMENT_OTHER): Payer: 59 | Admitting: Anesthesiology

## 2022-04-19 ENCOUNTER — Ambulatory Visit (HOSPITAL_BASED_OUTPATIENT_CLINIC_OR_DEPARTMENT_OTHER)
Admission: RE | Admit: 2022-04-19 | Discharge: 2022-04-19 | Disposition: A | Discharge status: Home / Self Care | Payer: 59 | Attending: Urology | Admitting: Urology

## 2022-04-19 ENCOUNTER — Encounter (HOSPITAL_BASED_OUTPATIENT_CLINIC_OR_DEPARTMENT_OTHER): Payer: Self-pay | Admitting: Urology

## 2022-04-19 ENCOUNTER — Encounter (HOSPITAL_BASED_OUTPATIENT_CLINIC_OR_DEPARTMENT_OTHER)
Admission: RE | Disposition: A | Discharge status: Home / Self Care | Payer: Self-pay | Source: Home / Self Care | Attending: Urology

## 2022-04-19 ENCOUNTER — Other Ambulatory Visit: Payer: Self-pay

## 2022-04-19 DIAGNOSIS — Z01818 Encounter for other preprocedural examination: Secondary | ICD-10-CM

## 2022-04-19 DIAGNOSIS — I129 Hypertensive chronic kidney disease with stage 1 through stage 4 chronic kidney disease, or unspecified chronic kidney disease: Secondary | ICD-10-CM | POA: Insufficient documentation

## 2022-04-19 DIAGNOSIS — G4733 Obstructive sleep apnea (adult) (pediatric): Secondary | ICD-10-CM | POA: Diagnosis not present

## 2022-04-19 DIAGNOSIS — Z6841 Body Mass Index (BMI) 40.0 and over, adult: Secondary | ICD-10-CM

## 2022-04-19 DIAGNOSIS — N200 Calculus of kidney: Secondary | ICD-10-CM

## 2022-04-19 DIAGNOSIS — N183 Chronic kidney disease, stage 3 unspecified: Secondary | ICD-10-CM | POA: Diagnosis not present

## 2022-04-19 DIAGNOSIS — I10 Essential (primary) hypertension: Secondary | ICD-10-CM | POA: Diagnosis not present

## 2022-04-19 HISTORY — DX: Spondylolisthesis, lumbar region: M43.16

## 2022-04-19 HISTORY — DX: Unspecified hemorrhoids: K64.9

## 2022-04-19 HISTORY — PX: CYSTOSCOPY WITH RETROGRADE PYELOGRAM, URETEROSCOPY AND STENT PLACEMENT: SHX5789

## 2022-04-19 HISTORY — DX: Anemia, unspecified: D64.9

## 2022-04-19 HISTORY — DX: Chronic kidney disease, stage 3 unspecified: N18.30

## 2022-04-19 HISTORY — DX: Personal history of urinary calculi: Z87.442

## 2022-04-19 HISTORY — DX: Low back pain, unspecified: M54.50

## 2022-04-19 HISTORY — DX: Major depressive disorder, single episode, unspecified: F32.9

## 2022-04-19 HISTORY — DX: Calculus of kidney: N20.0

## 2022-04-19 HISTORY — DX: Sacrococcygeal disorders, not elsewhere classified: M53.3

## 2022-04-19 LAB — POCT I-STAT, CHEM 8
BUN: 35 mg/dL — ABNORMAL HIGH (ref 6–20)
Calcium, Ion: 1.32 mmol/L (ref 1.15–1.40)
Chloride: 103 mmol/L (ref 98–111)
Creatinine, Ser: 1.3 mg/dL — ABNORMAL HIGH (ref 0.61–1.24)
Glucose, Bld: 90 mg/dL (ref 70–99)
HCT: 39 % (ref 39.0–52.0)
Hemoglobin: 13.3 g/dL (ref 13.0–17.0)
Potassium: 4.3 mmol/L (ref 3.5–5.1)
Sodium: 143 mmol/L (ref 135–145)
TCO2: 17 mmol/L — ABNORMAL LOW (ref 22–32)

## 2022-04-19 SURGERY — CYSTOURETEROSCOPY, WITH RETROGRADE PYELOGRAM AND STENT INSERTION
Anesthesia: General | Laterality: Left

## 2022-04-19 MED ORDER — PHENYLEPHRINE HCL-NACL 20-0.9 MG/250ML-% IV SOLN
INTRAVENOUS | Status: DC | PRN
  Administered 2022-04-19: 40 ug/min via INTRAVENOUS

## 2022-04-19 MED ORDER — CALCIUM CHLORIDE 10 % IV SOLN
INTRAVENOUS | Status: AC
  Filled 2022-04-19: qty 10

## 2022-04-19 MED ORDER — PHENYLEPHRINE HCL (PRESSORS) 10 MG/ML IV SOLN
INTRAVENOUS | Status: AC
  Filled 2022-04-19: qty 1

## 2022-04-19 MED ORDER — LACTATED RINGERS IV SOLN: INTRAVENOUS | Status: DC

## 2022-04-19 MED ORDER — GLYCOPYRROLATE PF 0.2 MG/ML IJ SOSY
PREFILLED_SYRINGE | INTRAMUSCULAR | Status: DC | PRN
  Administered 2022-04-19: .2 mg via INTRAVENOUS

## 2022-04-19 MED ORDER — FENTANYL CITRATE (PF) 100 MCG/2ML IJ SOLN: 25.0000 ug | INTRAMUSCULAR | Status: DC | PRN

## 2022-04-19 MED ORDER — CEFAZOLIN SODIUM-DEXTROSE 2-4 GM/100ML-% IV SOLN
2.0000 g | INTRAVENOUS | Status: AC
  Administered 2022-04-19: 2 g via INTRAVENOUS
  Administered 2022-04-19: 1 g via INTRAVENOUS

## 2022-04-19 MED ORDER — FENTANYL CITRATE (PF) 100 MCG/2ML IJ SOLN
INTRAMUSCULAR | Status: AC
  Filled 2022-04-19: qty 2

## 2022-04-19 MED ORDER — LIDOCAINE 2% (20 MG/ML) 5 ML SYRINGE
INTRAMUSCULAR | Status: DC | PRN
  Administered 2022-04-19: 100 mg via INTRAVENOUS

## 2022-04-19 MED ORDER — MIDAZOLAM HCL 2 MG/2ML IJ SOLN
INTRAMUSCULAR | Status: DC | PRN
  Administered 2022-04-19: 2 mg via INTRAVENOUS

## 2022-04-19 MED ORDER — ONDANSETRON HCL 4 MG/2ML IJ SOLN
INTRAMUSCULAR | Status: DC | PRN
  Administered 2022-04-19: 4 mg via INTRAVENOUS

## 2022-04-19 MED ORDER — LIDOCAINE HCL (PF) 2 % IJ SOLN
INTRAMUSCULAR | Status: AC
  Filled 2022-04-19: qty 5

## 2022-04-19 MED ORDER — ROCURONIUM BROMIDE 10 MG/ML (PF) SYRINGE
PREFILLED_SYRINGE | INTRAVENOUS | Status: AC
  Filled 2022-04-19: qty 10

## 2022-04-19 MED ORDER — EPHEDRINE SULFATE-NACL 50-0.9 MG/10ML-% IV SOSY
PREFILLED_SYRINGE | INTRAVENOUS | Status: DC | PRN
  Administered 2022-04-19: 10 mg via INTRAVENOUS
  Administered 2022-04-19: 15 mg via INTRAVENOUS
  Administered 2022-04-19: 25 mg via INTRAVENOUS

## 2022-04-19 MED ORDER — GLYCOPYRROLATE PF 0.2 MG/ML IJ SOSY
PREFILLED_SYRINGE | INTRAMUSCULAR | Status: AC
  Filled 2022-04-19: qty 1

## 2022-04-19 MED ORDER — PHENYLEPHRINE 80 MCG/ML (10ML) SYRINGE FOR IV PUSH (FOR BLOOD PRESSURE SUPPORT)
PREFILLED_SYRINGE | INTRAVENOUS | Status: DC | PRN
  Administered 2022-04-19 (×2): 80 ug via INTRAVENOUS
  Administered 2022-04-19 (×2): 160 ug via INTRAVENOUS
  Administered 2022-04-19 (×2): 80 ug via INTRAVENOUS
  Administered 2022-04-19: 160 ug via INTRAVENOUS

## 2022-04-19 MED ORDER — IOHEXOL 300 MG/ML  SOLN
INTRAMUSCULAR | Status: DC | PRN
  Administered 2022-04-19: 10 mL via URETHRAL

## 2022-04-19 MED ORDER — ALBUMIN HUMAN 5 % IV SOLN
INTRAVENOUS | Status: AC
  Filled 2022-04-19: qty 500

## 2022-04-19 MED ORDER — MIDAZOLAM HCL 2 MG/2ML IJ SOLN
INTRAMUSCULAR | Status: AC
  Filled 2022-04-19: qty 2

## 2022-04-19 MED ORDER — PROPOFOL 10 MG/ML IV BOLUS
INTRAVENOUS | Status: DC | PRN
  Administered 2022-04-19: 200 mg via INTRAVENOUS

## 2022-04-19 MED ORDER — CEFAZOLIN SODIUM-DEXTROSE 2-4 GM/100ML-% IV SOLN
INTRAVENOUS | Status: AC
  Filled 2022-04-19: qty 100

## 2022-04-19 MED ORDER — PROPOFOL 500 MG/50ML IV EMUL
INTRAVENOUS | Status: AC
  Filled 2022-04-19: qty 50

## 2022-04-19 MED ORDER — CEFAZOLIN SODIUM 1 G IJ SOLR
INTRAMUSCULAR | Status: AC
  Filled 2022-04-19: qty 10

## 2022-04-19 MED ORDER — CALCIUM CHLORIDE 10 % IV SOLN
INTRAVENOUS | Status: DC | PRN
  Administered 2022-04-19: 100 meq via INTRAVENOUS
  Administered 2022-04-19 (×2): 200 meq via INTRAVENOUS

## 2022-04-19 MED ORDER — SODIUM CHLORIDE 0.9 % IV SOLN: INTRAVENOUS | Status: DC

## 2022-04-19 MED ORDER — ALBUMIN HUMAN 5 % IV SOLN: INTRAVENOUS | Status: DC | PRN

## 2022-04-19 MED ORDER — SODIUM CHLORIDE (PF) 0.9 % IJ SOLN
INTRAMUSCULAR | Status: AC
  Filled 2022-04-19: qty 20

## 2022-04-19 MED ORDER — SODIUM CHLORIDE 0.9 % IR SOLN
Status: DC | PRN
  Administered 2022-04-19: 3000 mL via INTRAVESICAL

## 2022-04-19 MED ORDER — ONDANSETRON HCL 4 MG/2ML IJ SOLN
INTRAMUSCULAR | Status: AC
  Filled 2022-04-19: qty 2

## 2022-04-19 MED ORDER — TAMSULOSIN HCL 0.4 MG PO CAPS: 0.4000 mg | ORAL_CAPSULE | Freq: Every day | ORAL | 1 refills | Status: DC

## 2022-04-19 MED ORDER — SUCCINYLCHOLINE CHLORIDE 200 MG/10ML IV SOSY
PREFILLED_SYRINGE | INTRAVENOUS | Status: DC | PRN
  Administered 2022-04-19: 160 mg via INTRAVENOUS

## 2022-04-19 MED ORDER — FENTANYL CITRATE (PF) 100 MCG/2ML IJ SOLN
INTRAMUSCULAR | Status: DC | PRN
  Administered 2022-04-19 (×2): 50 ug via INTRAVENOUS

## 2022-04-19 SURGICAL SUPPLY — 20 items
BAG DRAIN URO-CYSTO SKYTR STRL (DRAIN) ×1 IMPLANT
BAG DRN UROCATH (DRAIN) ×1
BASKET ZERO TIP NITINOL 2.4FR (BASKET) IMPLANT
BSKT STON RTRVL ZERO TP 2.4FR (BASKET) ×1
CATH URETL OPEN END 6FR 70 (CATHETERS) IMPLANT
CLOTH BEACON ORANGE TIMEOUT ST (SAFETY) ×1 IMPLANT
GLOVE BIO SURGEON STRL SZ7.5 (GLOVE) ×1 IMPLANT
GLOVE BIOGEL PI IND STRL 6.5 (GLOVE) IMPLANT
GOWN STRL REUS W/TWL XL LVL3 (GOWN DISPOSABLE) IMPLANT
GUIDEWIRE STR DUAL SENSOR (WIRE) ×1 IMPLANT
IV NS IRRIG 3000ML ARTHROMATIC (IV SOLUTION) ×1 IMPLANT
KIT TURNOVER CYSTO (KITS) ×1 IMPLANT
MANIFOLD NEPTUNE II (INSTRUMENTS) ×1 IMPLANT
NS IRRIG 500ML POUR BTL (IV SOLUTION) ×1 IMPLANT
PACK CYSTO (CUSTOM PROCEDURE TRAY) ×1 IMPLANT
STENT URET 6FRX26 CONTOUR (STENTS) IMPLANT
TRACTIP FLEXIVA PULS ID 200XHI (Laser) IMPLANT
TRACTIP FLEXIVA PULSE ID 200 (Laser) ×1
TUBE CONNECTING 12X1/4 (SUCTIONS) IMPLANT
TUBING UROLOGY SET (TUBING) IMPLANT

## 2022-04-19 NOTE — Op Note (Signed)
Operative Note  Preoperative diagnosis:  1.  Left renal calculus  Postoperative diagnosis: 1.  Left renal calculus  Procedure(s): 1.  Cystoscopy with left retrograde pyelogram, left ureteroscopy with laser lithotripsy and stone extraction, ureteral stent placement  Surgeon: Modena Slater, MD  Assistants: None  Anesthesia: General  Complications: None immediate  EBL: Minimal  Specimens: 1.  Renal calculi  Drains/Catheters: 1.  6 x 26 double-J ureteral stent  Intraoperative findings: 1.  Normal urethra and bladder 2.  3 lower pole calculi fragmented to smaller fragments and basket extracted.  All stones removed except for a couple of fragments too small for the basket.  Retrograde pyelogram revealed no hydronephrosis.  There was a little bit of fullness in the renal pelvis.  No filling defect after treatment of the stone.  Indication: 36 year old male with left renal calculus status post ESWL presents for definitive management of the stone with ureteroscopy.  Description of procedure:  The patient was identified and consent was obtained.  The patient was taken to the operating room and placed in the supine position.  The patient was placed under general anesthesia.  Perioperative antibiotics were administered.  The patient was placed in dorsal lithotomy.  Patient was prepped and draped in a standard sterile fashion and a timeout was performed.  A 21 French rigid cystoscope was advanced into the urethra and into the bladder.  Complete cystoscopy was performed with findings noted above.  Left ureter was cannulated with a sensor wire which was advanced into the kidney under fluoroscopic guidance.  A second sensor wire was advanced alongside this up to the kidney.  Scope was withdrawn.  One of the wires was secured to the drape as a safety wire.  The other wire was used to advance an 11 x 13 ureteral access sheath over the wire under continuous fluoroscopic guidance up to the proximal  ureter.  Inner sheath and wire were withdrawn.  Digital ureteroscopy identified 3 stones.  These were fragmented to smaller fragments followed by basket extraction of all of the clinically significant stone fragments.  Retrograde pyelogram was then performed through the scope with findings noted above.  I withdrew the scope along with the access sheath visualizing the ureter upon removal.  There were no ureteral calculi and no ureteral injury was identified.  I backloaded the wire onto the rigid cystoscope and advanced that into the bladder followed by routine placement of a 6 x 26 double-J ureteral stent.  Fluoroscopy confirmed proximal placement and direct visualization confirmed a good coil within the bladder.  I drained the bladder and withdrew the scope.  Patient tolerated the procedure well stent postoperative.  Plan: Follow-up in 1 week for stent removal

## 2022-04-19 NOTE — Transfer of Care (Signed)
Immediate Anesthesia Transfer of Care Note  Patient: Hunter [name]  Procedure(s) Performed: Procedure(s) (LRB): CYSTOSCOPY WITH LEFT RETROGRADE PYELOGRAM, URETEROSCOPY  WITH HOLMIUM LASER AND STENT PLACEMENT (Left)  Patient Location: PACU  Anesthesia Type: General  Level of Consciousness: awake, alert  and oriented  Airway & Oxygen Therapy: Patient Spontanous Breathing and Patient connected to face mask oxygen  Post-op Assessment: Report given to PACU RN and Post -op Vital signs reviewed and stable  Post vital signs: Reviewed and stable  Complications: No apparent anesthesia complications  Last Vitals:  Vitals Value Taken Time  BP 124/63 04/19/22 0915  Temp 37.1 C 04/19/22 0907  Pulse 82 04/19/22 0919  Resp 17 04/19/22 0919  SpO2 95 % 04/19/22 0919  Vitals shown include unvalidated device data.  Last Pain:  Vitals:   04/19/22 0603  TempSrc: Oral  PainSc: 4       Patients Stated Pain Goal: 6 (04/19/22 0907)  Complications: No notable events documented.

## 2022-04-19 NOTE — Discharge Instructions (Addendum)
[facility] [phone] Post Ureteroscopy With or Without Stent Instructions  Definitions:  Ureter: The duct that transports urine from the kidney to the bladder. Stent:   A plastic hollow tube that is placed into the ureter, from the kidney to the                 bladder to prevent the ureter from swelling shut.  GENERAL INSTRUCTIONS:  Despite the fact that no skin incisions were used, the area around the ureter and bladder is raw and irritated. The stent is a foreign body which will further irritate the bladder wall. This irritation is manifested by increased frequency of urination, both day and night, and by an increase in the urge to urinate. In some, the urge to urinate is present almost always. Sometimes the urge is strong enough that you may not be able to stop yourself from urinating. The only real cure is to remove the stent and then give time for the bladder wall to heal which can't be done until the danger of the ureter swelling shut has passed, which varies.  You may see some blood in your urine while the stent is in place and a few days afterwards. Do not be alarmed, even if the urine was clear for a while. Get off your feet and drink lots of fluids until clearing occurs. If you start to pass clots or don't improve, call us.  DIET: You may return to your normal diet immediately. Because of the raw surface of your bladder, alcohol, spicy foods, acid type foods and drinks with caffeine may cause irritation or frequency and should be used in moderation. To keep your urine flowing freely and to avoid constipation, drink plenty of fluids during the day ( 8-10 glasses ). Tip: Avoid cranberry juice because it is very acidic.  ACTIVITY: Your physical activity doesn't need to be restricted. However, if you are very active, you may see some blood in your urine. We suggest that you reduce your activity under these circumstances until the bleeding has stopped.  BOWELS: It is  important to keep your bowels regular during the postoperative period. Straining with bowel movements can cause bleeding. A bowel movement every other day is reasonable. Use a mild laxative if needed, such as Milk of Magnesia 2-3 tablespoons, or 2 Dulcolax tablets. Call if you continue to have problems. If you have been taking narcotics for pain, before, during or after your surgery, you may be constipated. Take a laxative if necessary.   MEDICATION: You should resume your pre-surgery medications unless told not to. You may take oxybutynin or flomax if prescribed for bladder spasms or discomfort from the stent Take pain medication as directed for pain refractory to conservative management  PROBLEMS YOU SHOULD REPORT TO US: Fevers over 100.5 Fahrenheit. Heavy bleeding, or clots ( See above notes about blood in urine ). Inability to urinate. Drug reactions ( hives, rash, nausea, vomiting, diarrhea ). Severe burning or pain with urination that is not improving.   Post Anesthesia Home Care Instructions  Activity: Get plenty of rest for the remainder of the day. A responsible individual must stay with you for 24 hours following the procedure.  For the next 24 hours, DO NOT: -Drive a car -Operate machinery -Drink alcoholic beverages -Take any medication unless instructed by your physician -Make any legal decisions or sign important papers.  Meals: Start with liquid foods such as gelatin or soup. Progress to regular foods as tolerated. Avoid greasy, spicy,   heavy foods. If nausea and/or vomiting occur, drink only clear liquids until the nausea and/or vomiting subsides. Call your physician if vomiting continues.  Special Instructions/Symptoms: Your throat may feel dry or sore from the anesthesia or the breathing tube placed in your throat during surgery. If this causes discomfort, gargle with warm salt water. The discomfort should disappear within 24 hours.  

## 2022-04-19 NOTE — Anesthesia Preprocedure Evaluation (Addendum)
Anesthesia Evaluation  Patient identified by MRN, date of birth, ID band Patient awake  General Assessment Comment:Hx noted Dr. Chilton Si  Reviewed: Allergy & Precautions, Patient's Chart, lab work & pertinent test results  Airway Mallampati: III       Dental   Pulmonary sleep apnea    breath sounds clear to auscultation       Cardiovascular hypertension,  Rhythm:Regular Rate:Normal     Neuro/Psych    GI/Hepatic negative GI ROS, Neg liver ROS,,,  Endo/Other    Morbid obesity  Renal/GU Renal disease     Musculoskeletal   Abdominal   Peds  Hematology   Anesthesia Other Findings   Reproductive/Obstetrics                             Anesthesia Physical Anesthesia Plan  ASA: 3  Anesthesia Plan: General   Post-op Pain Management: Tylenol PO (pre-op)*   Induction: Intravenous  PONV Risk Score and Plan: 3 and Ondansetron, Dexamethasone and Midazolam  Airway Management Planned: Oral ETT  Additional Equipment:   Intra-op Plan:   Post-operative Plan: Possible Post-op intubation/ventilation  Informed Consent: I have reviewed the patients History and Physical, chart, labs and discussed the procedure including the risks, benefits and alternatives for the proposed anesthesia with the patient or authorized representative who has indicated his/her understanding and acceptance.     Dental advisory given  Plan Discussed with: CRNA and Anesthesiologist  Anesthesia Plan Comments:        Anesthesia Quick Evaluation

## 2022-04-19 NOTE — Anesthesia Procedure Notes (Signed)
Procedure Name: Intubation Date/Time: 04/19/2022 7:49 AM  Performed by: Norva Pavlov, CRNAPre-anesthesia Checklist: Patient identified, Emergency Drugs available, Suction available and Patient being monitored Patient Re-evaluated:Patient Re-evaluated prior to induction Oxygen Delivery Method: Circle system utilized Preoxygenation: Pre-oxygenation with 100% oxygen Induction Type: IV induction Ventilation: Mask ventilation without difficulty Tube type: Oral Tube size: 7.5 mm Number of attempts: 1 Airway Equipment and Method: Oral airway, Rigid stylet and Video-laryngoscopy Placement Confirmation: ETT inserted through vocal cords under direct vision, positive ETCO2 and breath sounds checked- equal and bilateral Secured at: 23 cm Tube secured with: Tape Dental Injury: Teeth and Oropharynx as per pre-operative assessment

## 2022-04-19 NOTE — H&P (Signed)
H&P   Chief Complaint: Left renal calculus  History of Present Illness: 36 year old male with a left renal calculus status post ESWL with residual stone presents for ureteroscopy.  Past Medical History:  Diagnosis Date   ADHD (attention deficit hyperactivity disorder)    Chronic low back pain    CKD (chronic kidney disease), stage III (HCC)    Hemorrhoids    History of kidney stones    Hypertension    MDD (major depressive disorder)    Normocytic anemia    hematologist---- dr Truett Perna  (transfusion 09/ 2022 one unit PRBC   OSA on CPAP 2014   per pt uses nightly   Renal calculus, left    SI (sacroiliac) joint dysfunction    Spondylolisthesis, lumbar region    L3 spondelothesis   Past Surgical History:  Procedure Laterality Date   COLONOSCOPY  08/2020   EXTRACORPOREAL SHOCK WAVE LITHOTRIPSY Left 02/04/2022   Procedure: LEFT EXTRACORPOREAL SHOCK WAVE LITHOTRIPSY (ESWL);  Surgeon: Jannifer Hick, MD;  Location: [facility];  Service: Urology;  Laterality: Left;   EXTRACORPOREAL SHOCK WAVE LITHOTRIPSY     x2  last one 2021   MOLE REMOVAL  1990   left eyelid   ORIF RADIUS & ULNA FRACTURES Right 2004   per pt same year 6 months later all hardware removed   PILONIDAL CYST EXCISION  11/2010   POSTERIOR LUMBAR FUSION  03/18/2017   @ [facility];  L3--4   RHINOPLASTY  2006    Home Medications:  Medications Prior to Admission  Medication Sig Dispense Refill Last Dose   bisoprolol (ZEBETA) 5 MG tablet Take 1 tablet (5 mg total) by mouth daily. (Patient taking differently: Take 5 mg by mouth daily.) 90 tablet 3 04/19/2022 at 0400   buPROPion (WELLBUTRIN XL) 300 MG 24 hr tablet Take 1 tablet (300 mg total) by mouth every morning. 90 tablet 1 04/19/2022 at 0400   docusate sodium (COLACE) 100 MG capsule Take 1 capsule (100 mg total) by mouth daily as needed. 30 capsule 2 Past Month   ezetimibe (ZETIA) 10 MG tablet Take 1 tablet (10 mg total) by mouth daily. (Patient taking  differently: Take 10 mg by mouth daily.) 90 tablet 3 04/19/2022 at 0400   fenofibrate 160 MG tablet Take 1 tablet (160 mg total) by mouth daily. (Patient taking differently: Take 160 mg by mouth daily.) 90 tablet 3 04/19/2022 at 0400   gabapentin (NEURONTIN) 300 MG capsule Take 1 capsule (300 mg total) by mouth 3 (three) times daily. 270 capsule 0 04/19/2022 at 0400   HYDROcodone-acetaminophen (NORCO/VICODIN) 5-325 MG tablet Take 1 tablet by mouth at bedtime as needed for pain (as needed for severe pain only) 15 tablet 0 Past Month   lisinopril (ZESTRIL) 40 MG tablet Take 40 mg by mouth daily.   04/18/2022   methylphenidate (CONCERTA) 36 MG PO CR tablet Take 1 tablet (36 mg total) by mouth every morning. 04/14/22 (Patient taking differently: Take 36 mg by mouth every morning.) 30 tablet 0 04/16/2022   omega-3 acid ethyl esters (LOVAZA) 1 g capsule Take 1 capsule (1 g total) by mouth at bedtime. (Patient taking differently: Take 1 g by mouth at bedtime.)   04/18/2022   Semaglutide-Weight Management (WEGOVY) 0.5 MG/0.5ML SOAJ Inject 0.5 mg into the skin once a week. (Patient taking differently: Inject 0.5 mg into the skin once a week. Saturday's) 2 mL 5 04/10/2022   tiZANidine (ZANAFLEX) 4 MG tablet Take 1 tablet (4 mg total) by  mouth every 8 (eight) hours as needed. (Patient taking differently: Take 4 mg by mouth 3 (three) times daily.) 90 tablet 2 04/19/2022 at 0400   traMADol (ULTRAM) 50 MG tablet Take 1-2 tablets (50-100 mg total) by mouth 3 (three) times daily as needed for severe pain. 180 tablet 2 04/18/2022   Allergies: No Known Allergies  Family History  Problem Relation Age of Onset   Hypertension Mother    Cancer Mother        cancer   Social History:  reports that he has never smoked. He has never used smokeless tobacco. He reports that he does not currently use alcohol. He reports that he does not use drugs.  ROS: A complete review of systems was performed.  All systems are negative  except for pertinent findings as noted. ROS   Physical Exam:  Vital signs in last 24 hours: Temp:  [97.7 F (36.5 C)] 97.7 F (36.5 C) (12/18 0603) Pulse Rate:  [70] 70 (12/18 0603) Resp:  [18] 18 (12/18 0603) BP: (121)/(65) 121/65 (12/18 0603) SpO2:  [96 %] 96 % (12/18 0603) Weight:  [158.4 kg] 158.4 kg (12/18 0603) General:  Alert and oriented, No acute distress HEENT: Normocephalic, atraumatic Neck: No JVD or lymphadenopathy Cardiovascular: Regular rate and rhythm Lungs: Regular rate and effort Abdomen: Soft, nontender, nondistended, no abdominal masses Back: No CVA tenderness Extremities: No edema Neurologic: Grossly intact  Laboratory Data:  Results for orders placed or performed during the hospital encounter of 04/19/22 (from the past 24 hour(s))  I-STAT, chem 8     Status: Abnormal   Collection Time: 04/19/22  7:12 AM  Result Value Ref Range   Sodium 143 135 - 145 mmol/L   Potassium 4.3 3.5 - 5.1 mmol/L   Chloride 103 98 - 111 mmol/L   BUN 35 (H) 6 - 20 mg/dL   Creatinine, Ser 3.66 (H) 0.61 - 1.24 mg/dL   Glucose, Bld 90 70 - 99 mg/dL   Calcium, Ion 2.94 7.65 - 1.40 mmol/L   TCO2 17 (L) 22 - 32 mmol/L   Hemoglobin 13.3 13.0 - 17.0 g/dL   HCT 46.5 03.5 - 46.5 %   No results found for this or any previous visit (from the past 240 hour(s)). Creatinine: Recent Labs    04/19/22 6812  CREATININE 1.30*    Impression/Assessment:  Left renal calculus  Plan:  Proceed with cystoscopy with left ureteroscopy with laser lithotripsy and ureteral stent placement.  Risk benefits discussed including but not limited to bleeding, infection, injury to surrounding structures including ureteral avulsion, need for additional procedures, possible staged procedure, need for stent among other imponderables.  Ray Church, III 04/19/2022, 7:29 AM

## 2022-04-19 NOTE — Anesthesia Postprocedure Evaluation (Signed)
Anesthesia Post Note  Patient: Hunter [name]  Procedure(s) Performed: CYSTOSCOPY WITH LEFT RETROGRADE PYELOGRAM, URETEROSCOPY  WITH HOLMIUM LASER AND STENT PLACEMENT (Left)     Patient location during evaluation: PACU Anesthesia Type: General Level of consciousness: awake Pain management: pain level controlled Vital Signs Assessment: post-procedure vital signs reviewed and stable Respiratory status: spontaneous breathing Cardiovascular status: stable Postop Assessment: no apparent nausea or vomiting Anesthetic complications: no   No notable events documented.  Last Vitals:  Vitals:   04/19/22 0945 04/19/22 1008  BP: (!) 108/48 133/82  Pulse: 78 81  Resp: 12 14  Temp: 36.6 C 36.7 C  SpO2: 93% 96%    Last Pain:  Vitals:   04/19/22 0603  TempSrc: Oral  PainSc: 4                  Brealynn Contino

## 2022-04-20 ENCOUNTER — Encounter (HOSPITAL_BASED_OUTPATIENT_CLINIC_OR_DEPARTMENT_OTHER): Payer: Self-pay | Admitting: Urology

## 2022-04-20 LAB — URINE CULTURE: Culture: NO GROWTH

## 2022-04-22 ENCOUNTER — Other Ambulatory Visit (HOSPITAL_BASED_OUTPATIENT_CLINIC_OR_DEPARTMENT_OTHER): Payer: Self-pay

## 2022-04-23 DIAGNOSIS — Z3141 Encounter for fertility testing: Secondary | ICD-10-CM | POA: Diagnosis not present

## 2022-04-27 ENCOUNTER — Other Ambulatory Visit (HOSPITAL_BASED_OUTPATIENT_CLINIC_OR_DEPARTMENT_OTHER): Payer: Self-pay

## 2022-04-27 DIAGNOSIS — N200 Calculus of kidney: Secondary | ICD-10-CM | POA: Diagnosis not present

## 2022-04-27 DIAGNOSIS — R8271 Bacteriuria: Secondary | ICD-10-CM | POA: Diagnosis not present

## 2022-04-28 MED ORDER — CALCIUM CHLORIDE 10 % IV SOLN
INTRAVENOUS | Status: DC | PRN
  Administered 2022-04-19 (×2): 200 mg via INTRAVENOUS
  Administered 2022-04-19: 100 mg via INTRAVENOUS

## 2022-04-28 NOTE — Addendum Note (Signed)
Addendum  created 04/28/22 1036 by Adria Dill, CRNA   Intraprocedure Meds edited

## 2022-05-06 ENCOUNTER — Other Ambulatory Visit (HOSPITAL_BASED_OUTPATIENT_CLINIC_OR_DEPARTMENT_OTHER): Payer: Self-pay

## 2022-05-06 MED ORDER — TAMSULOSIN HCL 0.4 MG PO CAPS
0.4000 mg | ORAL_CAPSULE | Freq: Every day | ORAL | 1 refills | Status: DC
  Filled 2022-05-06: qty 30, 30d supply, fill #0

## 2022-05-07 ENCOUNTER — Other Ambulatory Visit (HOSPITAL_BASED_OUTPATIENT_CLINIC_OR_DEPARTMENT_OTHER): Payer: Self-pay

## 2022-05-10 DIAGNOSIS — N200 Calculus of kidney: Secondary | ICD-10-CM | POA: Diagnosis not present

## 2022-05-13 ENCOUNTER — Other Ambulatory Visit (HOSPITAL_BASED_OUTPATIENT_CLINIC_OR_DEPARTMENT_OTHER): Payer: Self-pay

## 2022-05-13 MED ORDER — TRAMADOL HCL 50 MG PO TABS
50.0000 mg | ORAL_TABLET | Freq: Three times a day (TID) | ORAL | 1 refills | Status: AC | PRN
  Filled 2022-05-13: qty 180, 30d supply, fill #0
  Filled 2022-07-02: qty 180, 30d supply, fill #1

## 2022-05-13 MED ORDER — TIZANIDINE HCL 4 MG PO TABS
4.0000 mg | ORAL_TABLET | Freq: Three times a day (TID) | ORAL | 2 refills | Status: AC | PRN
  Filled 2022-05-13: qty 90, 30d supply, fill #0
  Filled 2022-07-02: qty 90, 30d supply, fill #1

## 2022-05-13 MED ORDER — HYDROCODONE-ACETAMINOPHEN 5-325 MG PO TABS
1.0000 | ORAL_TABLET | Freq: Every evening | ORAL | 0 refills | Status: DC | PRN
  Filled 2022-05-13: qty 15, 15d supply, fill #0

## 2022-05-13 MED ORDER — GABAPENTIN 300 MG PO CAPS
300.0000 mg | ORAL_CAPSULE | Freq: Three times a day (TID) | ORAL | 0 refills | Status: DC
  Filled 2022-05-13 – 2022-07-04 (×4): qty 270, 90d supply, fill #0

## 2022-05-15 ENCOUNTER — Other Ambulatory Visit (HOSPITAL_BASED_OUTPATIENT_CLINIC_OR_DEPARTMENT_OTHER): Payer: Self-pay

## 2022-05-17 ENCOUNTER — Other Ambulatory Visit (HOSPITAL_BASED_OUTPATIENT_CLINIC_OR_DEPARTMENT_OTHER): Payer: Self-pay

## 2022-05-17 MED ORDER — ALLOPURINOL 100 MG PO TABS
200.0000 mg | ORAL_TABLET | Freq: Every day | ORAL | 6 refills | Status: DC
  Filled 2022-05-17: qty 60, 30d supply, fill #0
  Filled 2022-06-13: qty 60, 30d supply, fill #1
  Filled 2022-07-13: qty 60, 30d supply, fill #2
  Filled 2022-08-11: qty 60, 30d supply, fill #3
  Filled 2022-09-12: qty 60, 30d supply, fill #4
  Filled 2022-10-11: qty 60, 30d supply, fill #5
  Filled 2022-11-07: qty 60, 30d supply, fill #6

## 2022-05-19 ENCOUNTER — Other Ambulatory Visit (HOSPITAL_BASED_OUTPATIENT_CLINIC_OR_DEPARTMENT_OTHER): Payer: Self-pay

## 2022-05-19 MED ORDER — INDAPAMIDE 2.5 MG PO TABS
2.5000 mg | ORAL_TABLET | Freq: Every day | ORAL | 6 refills | Status: DC
  Filled 2022-05-19: qty 30, 30d supply, fill #0
  Filled 2022-07-04: qty 30, 30d supply, fill #1
  Filled 2022-08-02: qty 30, 30d supply, fill #2
  Filled 2022-09-03: qty 30, 30d supply, fill #3
  Filled 2022-10-03: qty 30, 30d supply, fill #4
  Filled 2022-11-01: qty 30, 30d supply, fill #5
  Filled 2022-12-10: qty 30, 30d supply, fill #6

## 2022-05-27 ENCOUNTER — Other Ambulatory Visit (HOSPITAL_BASED_OUTPATIENT_CLINIC_OR_DEPARTMENT_OTHER): Payer: Self-pay

## 2022-06-01 ENCOUNTER — Other Ambulatory Visit (HOSPITAL_BASED_OUTPATIENT_CLINIC_OR_DEPARTMENT_OTHER): Payer: Self-pay

## 2022-06-01 MED ORDER — METHYLPHENIDATE HCL ER (OSM) 36 MG PO TBCR
36.0000 mg | EXTENDED_RELEASE_TABLET | Freq: Every morning | ORAL | 0 refills | Status: DC
  Filled 2022-06-01: qty 30, 30d supply, fill #0

## 2022-06-01 MED ORDER — METHYLPHENIDATE HCL ER 36 MG PO TB24
36.0000 mg | ORAL_TABLET | Freq: Every morning | ORAL | 0 refills | Status: AC
  Filled 2022-06-01 – 2022-06-09 (×2): qty 30, 30d supply, fill #0

## 2022-06-03 DIAGNOSIS — N200 Calculus of kidney: Secondary | ICD-10-CM | POA: Diagnosis not present

## 2022-06-04 ENCOUNTER — Other Ambulatory Visit (HOSPITAL_BASED_OUTPATIENT_CLINIC_OR_DEPARTMENT_OTHER): Payer: Self-pay

## 2022-06-04 ENCOUNTER — Inpatient Hospital Stay: Payer: Commercial Managed Care - PPO

## 2022-06-04 ENCOUNTER — Inpatient Hospital Stay: Payer: Commercial Managed Care - PPO | Admitting: Oncology

## 2022-06-07 ENCOUNTER — Other Ambulatory Visit: Payer: Self-pay

## 2022-06-08 ENCOUNTER — Other Ambulatory Visit (HOSPITAL_BASED_OUTPATIENT_CLINIC_OR_DEPARTMENT_OTHER): Payer: Self-pay

## 2022-06-09 ENCOUNTER — Other Ambulatory Visit (HOSPITAL_BASED_OUTPATIENT_CLINIC_OR_DEPARTMENT_OTHER): Payer: Self-pay

## 2022-06-24 ENCOUNTER — Inpatient Hospital Stay: Payer: Commercial Managed Care - PPO | Attending: Oncology | Admitting: Oncology

## 2022-06-24 ENCOUNTER — Inpatient Hospital Stay: Payer: Commercial Managed Care - PPO

## 2022-06-24 VITALS — BP 110/58 | HR 86 | Temp 98.1°F | Resp 20 | Ht 70.0 in | Wt 342.8 lb

## 2022-06-24 DIAGNOSIS — D649 Anemia, unspecified: Secondary | ICD-10-CM

## 2022-06-24 DIAGNOSIS — R55 Syncope and collapse: Secondary | ICD-10-CM | POA: Insufficient documentation

## 2022-06-24 DIAGNOSIS — L0501 Pilonidal cyst with abscess: Secondary | ICD-10-CM | POA: Insufficient documentation

## 2022-06-24 DIAGNOSIS — G8929 Other chronic pain: Secondary | ICD-10-CM | POA: Diagnosis not present

## 2022-06-24 DIAGNOSIS — I959 Hypotension, unspecified: Secondary | ICD-10-CM | POA: Diagnosis not present

## 2022-06-24 DIAGNOSIS — M4316 Spondylolisthesis, lumbar region: Secondary | ICD-10-CM | POA: Diagnosis not present

## 2022-06-24 DIAGNOSIS — N179 Acute kidney failure, unspecified: Secondary | ICD-10-CM | POA: Diagnosis not present

## 2022-06-24 LAB — CBC WITH DIFFERENTIAL (CANCER CENTER ONLY)
Abs Immature Granulocytes: 0.03 10*3/uL (ref 0.00–0.07)
Basophils Absolute: 0 10*3/uL (ref 0.0–0.1)
Basophils Relative: 0 %
Eosinophils Absolute: 0.2 10*3/uL (ref 0.0–0.5)
Eosinophils Relative: 1 %
HCT: 39.1 % (ref 39.0–52.0)
Hemoglobin: 13.8 g/dL (ref 13.0–17.0)
Immature Granulocytes: 0 %
Lymphocytes Relative: 16 %
Lymphs Abs: 1.8 10*3/uL (ref 0.7–4.0)
MCH: 29.6 pg (ref 26.0–34.0)
MCHC: 35.3 g/dL (ref 30.0–36.0)
MCV: 83.7 fL (ref 80.0–100.0)
Monocytes Absolute: 1.2 10*3/uL — ABNORMAL HIGH (ref 0.1–1.0)
Monocytes Relative: 11 %
Neutro Abs: 8 10*3/uL — ABNORMAL HIGH (ref 1.7–7.7)
Neutrophils Relative %: 72 %
Platelet Count: 341 10*3/uL (ref 150–400)
RBC: 4.67 MIL/uL (ref 4.22–5.81)
RDW: 12.3 % (ref 11.5–15.5)
WBC Count: 11.2 10*3/uL — ABNORMAL HIGH (ref 4.0–10.5)
nRBC: 0 % (ref 0.0–0.2)

## 2022-06-24 LAB — RETICULOCYTES
Immature Retic Fract: 6.3 % (ref 2.3–15.9)
RBC.: 4.68 MIL/uL (ref 4.22–5.81)
Retic Count, Absolute: 79.6 10*3/uL (ref 19.0–186.0)
Retic Ct Pct: 1.7 % (ref 0.4–3.1)

## 2022-06-24 LAB — FERRITIN: Ferritin: 271 ng/mL (ref 24–336)

## 2022-06-24 NOTE — Progress Notes (Signed)
  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  He generally feels well.  He reports intentional weight loss.  He had an acute GI illness Monday evening after eating [place] food.  He reports acute onset nausea/vomiting and diarrhea that resolved by the next morning.  No fever.  Objective:  Vital signs in last 24 hours:  Blood pressure (!) 110/58, pulse 86, temperature 98.1 F (36.7 C), temperature source Temporal, resp. rate 20, height 5' 10"$  (1.778 m), weight (!) 342 lb 12.8 oz (155.5 kg), SpO2 99 %.    Resp: Lungs clear bilaterally Cardio: Regular rate and rhythm GI: No hepatosplenomegaly Vascular: No leg edema  Lab Results:  Lab Results  Component Value Date   WBC 11.2 (H) 06/24/2022   HGB 13.8 06/24/2022   HCT 39.1 06/24/2022   MCV 83.7 06/24/2022   PLT 341 06/24/2022   NEUTROABS 8.0 (H) 06/24/2022    CMP  Lab Results  Component Value Date   NA 143 04/19/2022   K 4.3 04/19/2022   CL 103 04/19/2022   CO2 25 02/10/2021   GLUCOSE 90 04/19/2022   BUN 35 (H) 04/19/2022   CREATININE 1.30 (H) 04/19/2022   CALCIUM 9.6 02/10/2021   PROT 6.8 01/06/2021   ALBUMIN 3.6 01/06/2021   AST 30 01/06/2021   ALT 45 (H) 01/06/2021   ALKPHOS 13 (L) 01/06/2021   BILITOT 0.6 01/06/2021   GFRNONAA >60 02/10/2021    Medications: I have reviewed the patient's current medications.   Assessment/Plan: Normocytic anemia 1 unit of packed red blood cells 01/05/2021 Bone marrow biopsy 01/08/2021-normocellular bone marrow with trilineage hematopoiesis.  Mild nonspecific changes involving myeloid cell lines with no increase in blasts.  No morphologic evidence of a lymphoproliferative process.  Storage iron is increased. Colonoscopy 02/04/2021-hemorrhoids found on perianal exam.  Entire examined colon normal.  No specimens collected.   2.  ARF 3.  Syncope/hypotension 4.  Pilonidal abscess 5.  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  The hemoglobin is now in the normal range.  The neutrophil count is mildly elevated today, potentially related to the gastrointestinal illness he had earlier this week.  He plans to continue follow-up with his primary provider, Dr. Eden Emms, at [facility].  He is scheduled for an appointment 09/22/2022.  He will have a CBC checked that day.  He will ask for the result to be forwarded to [place].  He is not scheduled for a follow-up appointment in the hematology clinic.  I am available to see him if he develops recurrent anemia or another hematologic abnormality.  Betsy Coder, MD  06/24/2022  8:41 AM

## 2022-07-01 ENCOUNTER — Other Ambulatory Visit: Payer: Self-pay

## 2022-07-02 ENCOUNTER — Other Ambulatory Visit: Payer: Self-pay

## 2022-07-05 ENCOUNTER — Other Ambulatory Visit: Payer: Self-pay

## 2022-07-07 ENCOUNTER — Other Ambulatory Visit (HOSPITAL_COMMUNITY): Payer: Self-pay

## 2022-07-12 ENCOUNTER — Other Ambulatory Visit (HOSPITAL_BASED_OUTPATIENT_CLINIC_OR_DEPARTMENT_OTHER): Payer: Self-pay

## 2022-07-12 DIAGNOSIS — F902 Attention-deficit hyperactivity disorder, combined type: Secondary | ICD-10-CM | POA: Diagnosis not present

## 2022-07-12 DIAGNOSIS — F3342 Major depressive disorder, recurrent, in full remission: Secondary | ICD-10-CM | POA: Diagnosis not present

## 2022-07-12 MED ORDER — METHYLPHENIDATE HCL ER (OSM) 36 MG PO TBCR
36.0000 mg | EXTENDED_RELEASE_TABLET | Freq: Every morning | ORAL | 0 refills | Status: DC
  Filled 2022-08-11 – 2022-09-26 (×3): qty 30, 30d supply, fill #0

## 2022-07-12 MED ORDER — BUPROPION HCL ER (XL) 300 MG PO TB24
300.0000 mg | ORAL_TABLET | Freq: Every morning | ORAL | 1 refills | Status: AC
  Filled 2022-07-12 – 2022-08-11 (×3): qty 90, 90d supply, fill #0
  Filled 2023-04-29: qty 90, 90d supply, fill #1

## 2022-07-12 MED ORDER — METHYLPHENIDATE HCL ER (OSM) 36 MG PO TBCR
36.0000 mg | EXTENDED_RELEASE_TABLET | Freq: Every morning | ORAL | 0 refills | Status: AC
  Filled 2022-08-17: qty 30, 30d supply, fill #0

## 2022-07-12 MED ORDER — METHYLPHENIDATE HCL ER (OSM) 36 MG PO TBCR
36.0000 mg | EXTENDED_RELEASE_TABLET | Freq: Every morning | ORAL | 0 refills | Status: AC
  Filled 2022-07-12: qty 30, 30d supply, fill #0

## 2022-07-13 ENCOUNTER — Other Ambulatory Visit (HOSPITAL_BASED_OUTPATIENT_CLINIC_OR_DEPARTMENT_OTHER): Payer: Self-pay

## 2022-07-13 MED ORDER — LISINOPRIL 40 MG PO TABS
40.0000 mg | ORAL_TABLET | Freq: Every day | ORAL | 1 refills | Status: AC
  Filled 2022-07-13: qty 90, 90d supply, fill #0

## 2022-08-02 ENCOUNTER — Other Ambulatory Visit: Payer: Self-pay

## 2022-08-04 ENCOUNTER — Other Ambulatory Visit: Payer: Self-pay

## 2022-08-05 ENCOUNTER — Other Ambulatory Visit (HOSPITAL_COMMUNITY): Payer: Self-pay

## 2022-08-05 ENCOUNTER — Other Ambulatory Visit: Payer: Self-pay

## 2022-08-05 MED ORDER — HYDROCODONE-ACETAMINOPHEN 5-325 MG PO TABS
1.0000 | ORAL_TABLET | Freq: Every evening | ORAL | 0 refills | Status: AC | PRN
  Filled 2022-08-05: qty 15, 15d supply, fill #0

## 2022-08-05 MED ORDER — GABAPENTIN 300 MG PO CAPS
300.0000 mg | ORAL_CAPSULE | Freq: Three times a day (TID) | ORAL | 0 refills | Status: DC
  Filled 2022-08-11 – 2022-10-03 (×3): qty 270, 90d supply, fill #0

## 2022-08-05 MED ORDER — TRAMADOL HCL 50 MG PO TABS
50.0000 mg | ORAL_TABLET | Freq: Three times a day (TID) | ORAL | 2 refills | Status: AC | PRN
  Filled 2022-08-05: qty 180, 30d supply, fill #0
  Filled 2022-09-26: qty 180, 30d supply, fill #1

## 2022-08-05 MED ORDER — TIZANIDINE HCL 4 MG PO TABS
4.0000 mg | ORAL_TABLET | Freq: Three times a day (TID) | ORAL | 2 refills | Status: AC | PRN
  Filled 2022-08-05: qty 90, 30d supply, fill #0
  Filled 2022-09-26: qty 90, 30d supply, fill #1

## 2022-08-09 ENCOUNTER — Other Ambulatory Visit (HOSPITAL_COMMUNITY): Payer: Self-pay

## 2022-08-11 ENCOUNTER — Other Ambulatory Visit (HOSPITAL_COMMUNITY): Payer: Self-pay

## 2022-08-11 ENCOUNTER — Other Ambulatory Visit: Payer: Self-pay

## 2022-08-12 ENCOUNTER — Other Ambulatory Visit (HOSPITAL_COMMUNITY): Payer: Self-pay

## 2022-08-17 ENCOUNTER — Other Ambulatory Visit (HOSPITAL_BASED_OUTPATIENT_CLINIC_OR_DEPARTMENT_OTHER): Payer: Self-pay

## 2022-09-06 DIAGNOSIS — N200 Calculus of kidney: Secondary | ICD-10-CM | POA: Diagnosis not present

## 2022-09-07 ENCOUNTER — Other Ambulatory Visit: Payer: Self-pay

## 2022-09-15 ENCOUNTER — Other Ambulatory Visit (HOSPITAL_BASED_OUTPATIENT_CLINIC_OR_DEPARTMENT_OTHER): Payer: Self-pay

## 2022-09-15 MED ORDER — POTASSIUM CITRATE ER 15 MEQ (1620 MG) PO TBCR
1.0000 | EXTENDED_RELEASE_TABLET | Freq: Two times a day (BID) | ORAL | 11 refills | Status: AC
  Filled 2022-09-15: qty 60, 30d supply, fill #0

## 2022-09-22 DIAGNOSIS — Z6841 Body Mass Index (BMI) 40.0 and over, adult: Secondary | ICD-10-CM | POA: Diagnosis not present

## 2022-09-22 DIAGNOSIS — E782 Mixed hyperlipidemia: Secondary | ICD-10-CM | POA: Diagnosis not present

## 2022-09-22 DIAGNOSIS — N1831 Chronic kidney disease, stage 3a: Secondary | ICD-10-CM | POA: Diagnosis not present

## 2022-09-22 DIAGNOSIS — R7303 Prediabetes: Secondary | ICD-10-CM | POA: Diagnosis not present

## 2022-09-22 DIAGNOSIS — I10 Essential (primary) hypertension: Secondary | ICD-10-CM | POA: Diagnosis not present

## 2022-09-23 ENCOUNTER — Other Ambulatory Visit (HOSPITAL_COMMUNITY): Payer: Self-pay

## 2022-09-23 MED ORDER — LISINOPRIL 20 MG PO TABS
20.0000 mg | ORAL_TABLET | Freq: Every day | ORAL | 5 refills | Status: AC
  Filled 2022-09-23 – 2022-11-01 (×2): qty 30, 30d supply, fill #0
  Filled 2023-01-02: qty 30, 30d supply, fill #1
  Filled 2023-02-09: qty 30, 30d supply, fill #2
  Filled 2023-03-27: qty 30, 30d supply, fill #3
  Filled 2023-07-26: qty 30, 30d supply, fill #4
  Filled 2023-09-01: qty 30, 30d supply, fill #5

## 2022-09-23 MED ORDER — ZEPBOUND 2.5 MG/0.5ML ~~LOC~~ SOAJ
SUBCUTANEOUS | 1 refills | Status: DC
  Filled 2022-09-23: qty 2, 28d supply, fill #0
  Filled 2022-09-26: qty 2, fill #0
  Filled 2023-03-16: qty 2, 28d supply, fill #0
  Filled 2023-04-29 – 2023-06-02 (×2): qty 2, 28d supply, fill #1

## 2022-09-27 ENCOUNTER — Other Ambulatory Visit: Payer: Self-pay

## 2022-09-28 ENCOUNTER — Other Ambulatory Visit: Payer: Self-pay

## 2022-09-28 ENCOUNTER — Other Ambulatory Visit (HOSPITAL_BASED_OUTPATIENT_CLINIC_OR_DEPARTMENT_OTHER): Payer: Self-pay

## 2022-10-01 ENCOUNTER — Other Ambulatory Visit (HOSPITAL_COMMUNITY): Payer: Self-pay

## 2022-10-04 ENCOUNTER — Other Ambulatory Visit (HOSPITAL_BASED_OUTPATIENT_CLINIC_OR_DEPARTMENT_OTHER): Payer: Self-pay

## 2022-10-04 ENCOUNTER — Other Ambulatory Visit: Payer: Self-pay

## 2022-10-04 DIAGNOSIS — F3342 Major depressive disorder, recurrent, in full remission: Secondary | ICD-10-CM | POA: Diagnosis not present

## 2022-10-04 DIAGNOSIS — F902 Attention-deficit hyperactivity disorder, combined type: Secondary | ICD-10-CM | POA: Diagnosis not present

## 2022-10-04 MED ORDER — METHYLPHENIDATE HCL ER (OSM) 36 MG PO TBCR: 36.0000 mg | EXTENDED_RELEASE_TABLET | Freq: Every morning | ORAL | 0 refills | Status: AC

## 2022-10-04 MED ORDER — BUPROPION HCL ER (XL) 300 MG PO TB24
300.0000 mg | ORAL_TABLET | Freq: Every morning | ORAL | 1 refills | Status: AC
  Filled 2022-10-04 – 2022-11-07 (×2): qty 90, 90d supply, fill #0
  Filled 2023-06-02: qty 90, 90d supply, fill #1

## 2022-10-04 MED ORDER — METHYLPHENIDATE HCL ER (OSM) 36 MG PO TBCR
36.0000 mg | EXTENDED_RELEASE_TABLET | Freq: Every morning | ORAL | 0 refills | Status: AC
  Filled 2022-10-04 – 2022-11-11 (×2): qty 30, 30d supply, fill #0

## 2022-10-11 DIAGNOSIS — Z6841 Body Mass Index (BMI) 40.0 and over, adult: Secondary | ICD-10-CM | POA: Diagnosis not present

## 2022-10-11 DIAGNOSIS — E782 Mixed hyperlipidemia: Secondary | ICD-10-CM | POA: Diagnosis not present

## 2022-10-11 DIAGNOSIS — I10 Essential (primary) hypertension: Secondary | ICD-10-CM | POA: Diagnosis not present

## 2022-10-11 DIAGNOSIS — R7303 Prediabetes: Secondary | ICD-10-CM | POA: Diagnosis not present

## 2022-10-28 ENCOUNTER — Other Ambulatory Visit (HOSPITAL_COMMUNITY): Payer: Self-pay

## 2022-10-28 MED ORDER — TIZANIDINE HCL 4 MG PO TABS
4.0000 mg | ORAL_TABLET | Freq: Three times a day (TID) | ORAL | 2 refills | Status: AC | PRN
  Filled 2022-10-28: qty 90, 30d supply, fill #0
  Filled 2022-12-10: qty 90, 30d supply, fill #1

## 2022-10-28 MED ORDER — HYDROCODONE-ACETAMINOPHEN 5-325 MG PO TABS
ORAL_TABLET | ORAL | 0 refills | Status: AC
  Filled 2022-10-28: qty 15, 15d supply, fill #0

## 2022-10-28 MED ORDER — GABAPENTIN 300 MG PO CAPS
300.0000 mg | ORAL_CAPSULE | Freq: Three times a day (TID) | ORAL | 0 refills | Status: DC
  Filled 2022-12-10 – 2023-01-02 (×2): qty 270, 90d supply, fill #0

## 2022-10-28 MED ORDER — TRAMADOL HCL 50 MG PO TABS
ORAL_TABLET | ORAL | 2 refills | Status: AC
  Filled 2022-10-28: qty 180, 30d supply, fill #0
  Filled 2022-12-10: qty 180, 30d supply, fill #1

## 2022-10-29 ENCOUNTER — Other Ambulatory Visit (HOSPITAL_COMMUNITY): Payer: Self-pay

## 2022-11-01 ENCOUNTER — Other Ambulatory Visit (HOSPITAL_COMMUNITY): Payer: Self-pay

## 2022-11-01 ENCOUNTER — Other Ambulatory Visit: Payer: Self-pay

## 2022-11-08 ENCOUNTER — Other Ambulatory Visit: Payer: Self-pay

## 2022-11-11 ENCOUNTER — Other Ambulatory Visit (HOSPITAL_BASED_OUTPATIENT_CLINIC_OR_DEPARTMENT_OTHER): Payer: Self-pay

## 2022-11-12 ENCOUNTER — Other Ambulatory Visit (HOSPITAL_BASED_OUTPATIENT_CLINIC_OR_DEPARTMENT_OTHER): Payer: Self-pay

## 2022-11-16 ENCOUNTER — Other Ambulatory Visit (HOSPITAL_BASED_OUTPATIENT_CLINIC_OR_DEPARTMENT_OTHER): Payer: Self-pay

## 2022-11-17 ENCOUNTER — Other Ambulatory Visit (HOSPITAL_BASED_OUTPATIENT_CLINIC_OR_DEPARTMENT_OTHER): Payer: Self-pay

## 2022-11-23 DIAGNOSIS — M5417 Radiculopathy, lumbosacral region: Secondary | ICD-10-CM | POA: Diagnosis not present

## 2022-12-08 ENCOUNTER — Other Ambulatory Visit (HOSPITAL_BASED_OUTPATIENT_CLINIC_OR_DEPARTMENT_OTHER): Payer: Self-pay

## 2022-12-10 ENCOUNTER — Other Ambulatory Visit (HOSPITAL_BASED_OUTPATIENT_CLINIC_OR_DEPARTMENT_OTHER): Payer: Self-pay

## 2022-12-10 ENCOUNTER — Other Ambulatory Visit (HOSPITAL_COMMUNITY): Payer: Self-pay

## 2022-12-10 ENCOUNTER — Other Ambulatory Visit: Payer: Self-pay

## 2022-12-10 MED ORDER — ALLOPURINOL 100 MG PO TABS
100.0000 mg | ORAL_TABLET | Freq: Every day | ORAL | 6 refills | Status: DC
  Filled 2022-12-10: qty 60, 60d supply, fill #0
  Filled 2023-04-29: qty 60, 60d supply, fill #1
  Filled 2023-07-03 – 2023-09-18 (×6): qty 60, 60d supply, fill #2
  Filled 2023-11-14 – 2023-11-28 (×2): qty 60, 60d supply, fill #3

## 2022-12-27 ENCOUNTER — Other Ambulatory Visit (HOSPITAL_COMMUNITY): Payer: Self-pay

## 2022-12-27 DIAGNOSIS — F3342 Major depressive disorder, recurrent, in full remission: Secondary | ICD-10-CM | POA: Diagnosis not present

## 2022-12-27 DIAGNOSIS — F902 Attention-deficit hyperactivity disorder, combined type: Secondary | ICD-10-CM | POA: Diagnosis not present

## 2022-12-27 MED ORDER — METHYLPHENIDATE HCL ER (OSM) 36 MG PO TBCR
36.0000 mg | EXTENDED_RELEASE_TABLET | Freq: Every morning | ORAL | 0 refills | Status: AC
  Filled 2023-01-19: qty 30, 30d supply, fill #0

## 2022-12-27 MED ORDER — METHYLPHENIDATE HCL ER (OSM) 36 MG PO TBCR
36.0000 mg | EXTENDED_RELEASE_TABLET | Freq: Every morning | ORAL | 0 refills | Status: AC
  Filled 2023-04-29: qty 30, 30d supply, fill #0

## 2022-12-27 MED ORDER — BUPROPION HCL ER (XL) 300 MG PO TB24: 300.0000 mg | ORAL_TABLET | Freq: Every morning | ORAL | 1 refills | Status: AC

## 2022-12-27 MED ORDER — METHYLPHENIDATE HCL ER (OSM) 36 MG PO TBCR
36.0000 mg | EXTENDED_RELEASE_TABLET | Freq: Every morning | ORAL | 0 refills | Status: AC
  Filled 2023-03-07: qty 30, 30d supply, fill #0

## 2022-12-28 ENCOUNTER — Other Ambulatory Visit (HOSPITAL_COMMUNITY): Payer: Self-pay

## 2023-01-02 ENCOUNTER — Other Ambulatory Visit (HOSPITAL_COMMUNITY): Payer: Self-pay

## 2023-01-04 ENCOUNTER — Other Ambulatory Visit: Payer: Self-pay

## 2023-01-04 ENCOUNTER — Other Ambulatory Visit (HOSPITAL_COMMUNITY): Payer: Self-pay

## 2023-01-04 MED ORDER — INDAPAMIDE 2.5 MG PO TABS
2.5000 mg | ORAL_TABLET | Freq: Every day | ORAL | 6 refills | Status: AC
  Filled 2023-01-04: qty 30, 30d supply, fill #0
  Filled 2023-02-09: qty 30, 30d supply, fill #1
  Filled 2023-03-27: qty 30, 30d supply, fill #2
  Filled 2023-04-29: qty 30, 30d supply, fill #3
  Filled 2023-06-02: qty 30, 30d supply, fill #4
  Filled 2023-07-03: qty 30, 30d supply, fill #5
  Filled 2023-08-05: qty 30, 30d supply, fill #6

## 2023-01-05 ENCOUNTER — Other Ambulatory Visit: Payer: Self-pay

## 2023-01-13 ENCOUNTER — Other Ambulatory Visit (HOSPITAL_BASED_OUTPATIENT_CLINIC_OR_DEPARTMENT_OTHER): Payer: Self-pay

## 2023-01-13 DIAGNOSIS — N200 Calculus of kidney: Secondary | ICD-10-CM | POA: Diagnosis not present

## 2023-01-13 MED ORDER — INDAPAMIDE 2.5 MG PO TABS
2.5000 mg | ORAL_TABLET | Freq: Every day | ORAL | 3 refills | Status: AC
  Filled 2023-01-13 – 2023-09-18 (×2): qty 90, 90d supply, fill #0
  Filled 2023-12-23: qty 90, 90d supply, fill #1

## 2023-01-13 MED ORDER — ALLOPURINOL 100 MG PO TABS
200.0000 mg | ORAL_TABLET | Freq: Every day | ORAL | 3 refills | Status: DC
  Filled 2023-01-13 – 2023-02-09 (×5): qty 180, 90d supply, fill #0
  Filled 2023-06-02 – 2023-06-26 (×2): qty 180, 90d supply, fill #1
  Filled 2023-09-28 – 2023-11-12 (×4): qty 180, 90d supply, fill #2

## 2023-01-16 ENCOUNTER — Other Ambulatory Visit (HOSPITAL_COMMUNITY): Payer: Self-pay

## 2023-01-17 ENCOUNTER — Other Ambulatory Visit: Payer: Self-pay

## 2023-01-19 ENCOUNTER — Other Ambulatory Visit (HOSPITAL_COMMUNITY): Payer: Self-pay

## 2023-01-20 ENCOUNTER — Other Ambulatory Visit (HOSPITAL_COMMUNITY): Payer: Self-pay

## 2023-01-23 ENCOUNTER — Other Ambulatory Visit (HOSPITAL_COMMUNITY): Payer: Self-pay

## 2023-01-26 ENCOUNTER — Other Ambulatory Visit (HOSPITAL_COMMUNITY): Payer: Self-pay

## 2023-01-26 DIAGNOSIS — M5417 Radiculopathy, lumbosacral region: Secondary | ICD-10-CM | POA: Diagnosis not present

## 2023-01-26 DIAGNOSIS — Z5181 Encounter for therapeutic drug level monitoring: Secondary | ICD-10-CM | POA: Diagnosis not present

## 2023-01-26 DIAGNOSIS — Z79899 Other long term (current) drug therapy: Secondary | ICD-10-CM | POA: Diagnosis not present

## 2023-01-26 DIAGNOSIS — Z79891 Long term (current) use of opiate analgesic: Secondary | ICD-10-CM | POA: Diagnosis not present

## 2023-01-26 DIAGNOSIS — M5416 Radiculopathy, lumbar region: Secondary | ICD-10-CM | POA: Diagnosis not present

## 2023-01-26 DIAGNOSIS — M961 Postlaminectomy syndrome, not elsewhere classified: Secondary | ICD-10-CM | POA: Diagnosis not present

## 2023-01-26 DIAGNOSIS — G894 Chronic pain syndrome: Secondary | ICD-10-CM | POA: Diagnosis not present

## 2023-01-26 MED ORDER — GABAPENTIN 300 MG PO CAPS
300.0000 mg | ORAL_CAPSULE | Freq: Three times a day (TID) | ORAL | 0 refills | Status: DC
  Filled 2023-03-30: qty 270, 90d supply, fill #0

## 2023-01-26 MED ORDER — TIZANIDINE HCL 4 MG PO TABS
4.0000 mg | ORAL_TABLET | Freq: Three times a day (TID) | ORAL | 2 refills | Status: AC | PRN
  Filled 2023-01-26: qty 90, 30d supply, fill #0
  Filled 2023-03-27: qty 90, 30d supply, fill #1

## 2023-01-26 MED ORDER — TRAMADOL HCL 50 MG PO TABS
50.0000 mg | ORAL_TABLET | Freq: Three times a day (TID) | ORAL | 2 refills | Status: AC | PRN
  Filled 2023-01-26: qty 180, 30d supply, fill #0
  Filled 2023-03-27: qty 180, 30d supply, fill #1

## 2023-01-27 ENCOUNTER — Other Ambulatory Visit (HOSPITAL_COMMUNITY): Payer: Self-pay

## 2023-01-27 MED ORDER — HYDROCODONE-ACETAMINOPHEN 5-325 MG PO TABS
1.0000 | ORAL_TABLET | Freq: Every day | ORAL | 0 refills | Status: AC
  Filled 2023-01-27: qty 15, 15d supply, fill #0

## 2023-01-28 ENCOUNTER — Other Ambulatory Visit (HOSPITAL_COMMUNITY): Payer: Self-pay

## 2023-01-31 ENCOUNTER — Other Ambulatory Visit (HOSPITAL_COMMUNITY): Payer: Self-pay

## 2023-02-04 DIAGNOSIS — N200 Calculus of kidney: Secondary | ICD-10-CM | POA: Diagnosis not present

## 2023-02-09 ENCOUNTER — Other Ambulatory Visit (HOSPITAL_COMMUNITY): Payer: Self-pay

## 2023-02-09 ENCOUNTER — Other Ambulatory Visit: Payer: Self-pay

## 2023-02-09 MED ORDER — EZETIMIBE 10 MG PO TABS
10.0000 mg | ORAL_TABLET | Freq: Every day | ORAL | 3 refills | Status: DC
  Filled 2023-02-09 (×2): qty 90, 90d supply, fill #0
  Filled 2023-05-23: qty 90, 90d supply, fill #1
  Filled 2023-08-23: qty 90, 90d supply, fill #2
  Filled 2023-11-28: qty 90, 90d supply, fill #3

## 2023-02-10 ENCOUNTER — Other Ambulatory Visit: Payer: Self-pay

## 2023-02-11 ENCOUNTER — Other Ambulatory Visit (HOSPITAL_COMMUNITY): Payer: Self-pay

## 2023-02-20 IMAGING — US US RENAL
1 series · 14 of 25 positions shown · non-contrast
Comparison: None.

CLINICAL DATA: Acute kidney injury.

EXAM:
RENAL / URINARY TRACT ULTRASOUND COMPLETE

[Series 1: renal us · 14 of 34 slices shown]
[im 1/34]
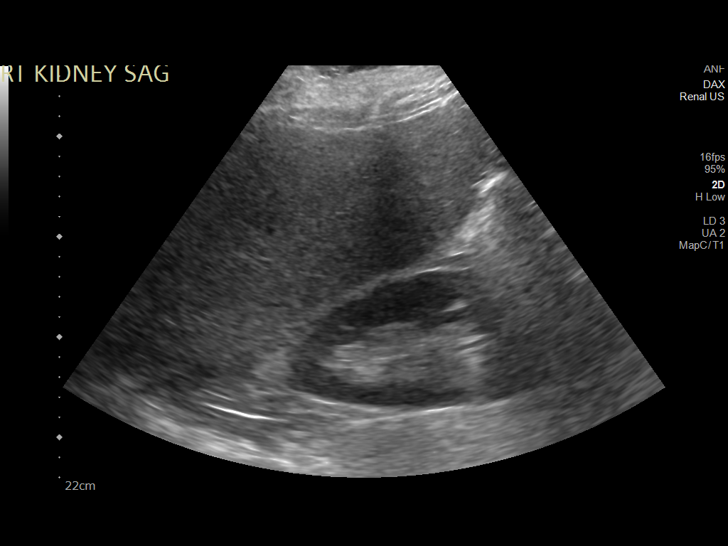
[im 3/34]
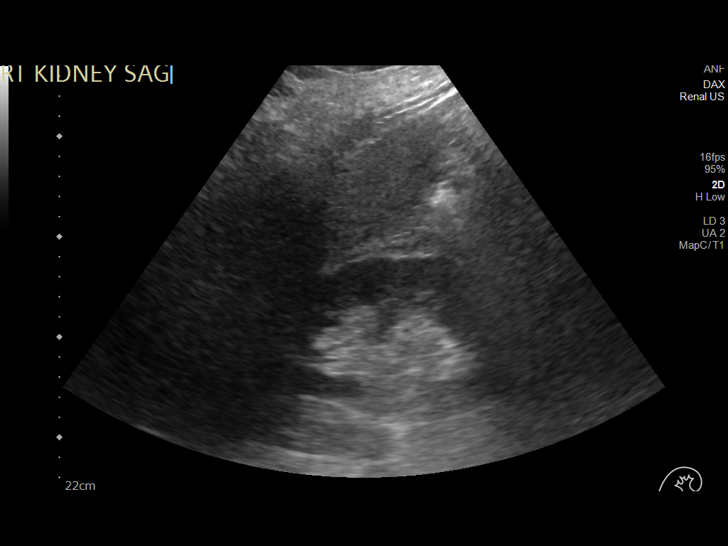
[im 6/34]
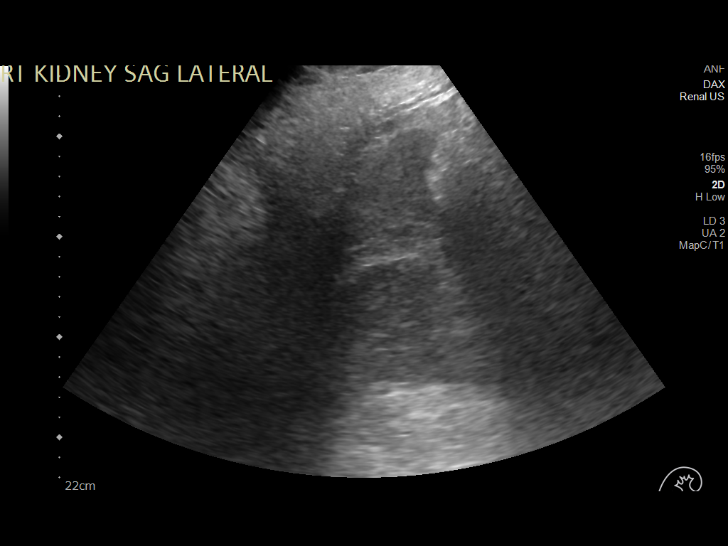
[im 9/34]
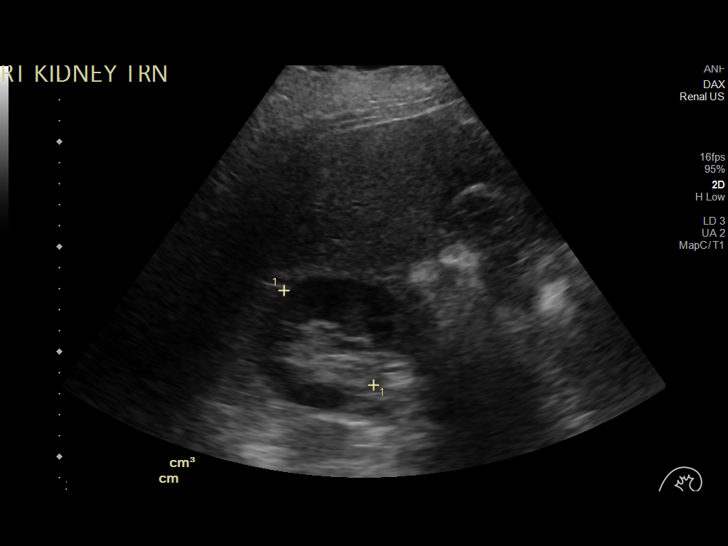
[im 12/34]
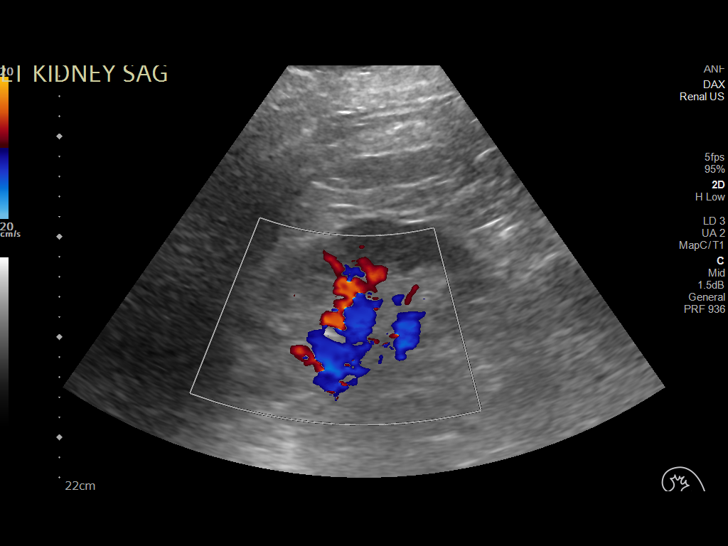
[im 13/34]
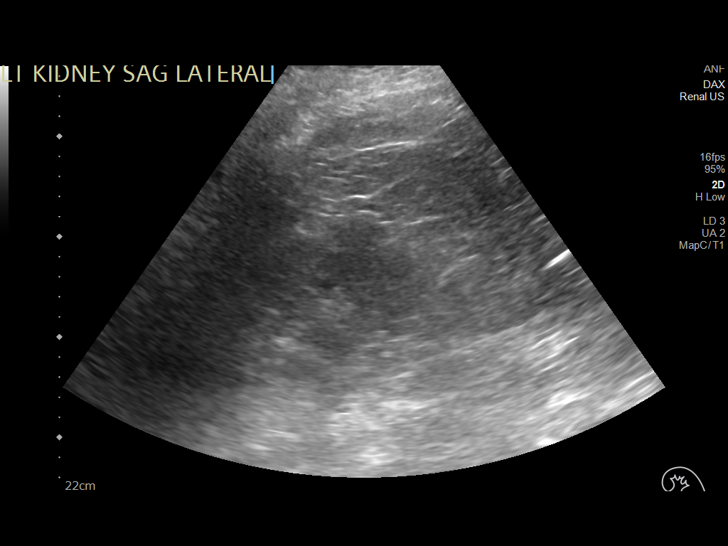
[im 16/34]
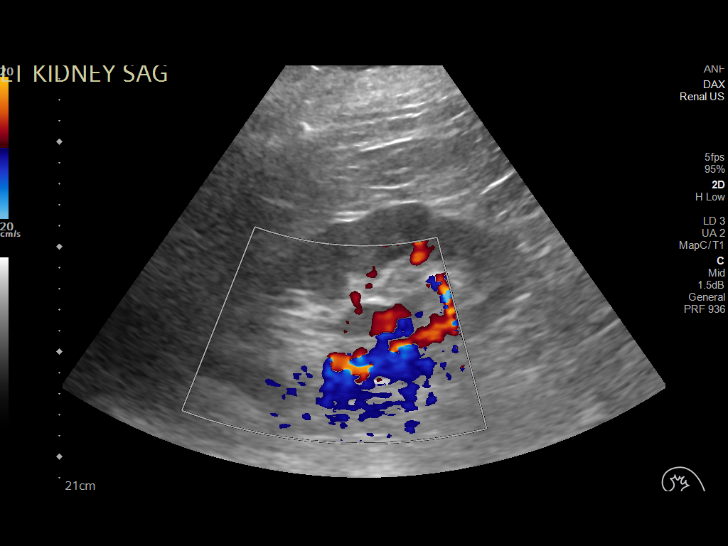
[im 18/34]
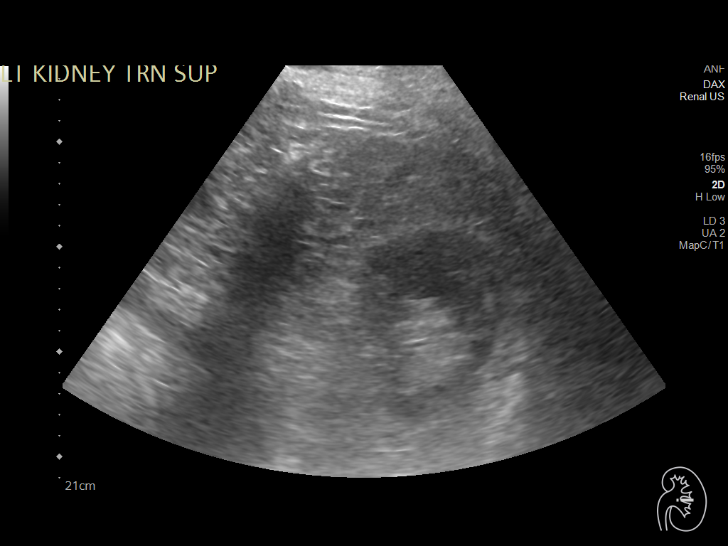
[im 21/34]
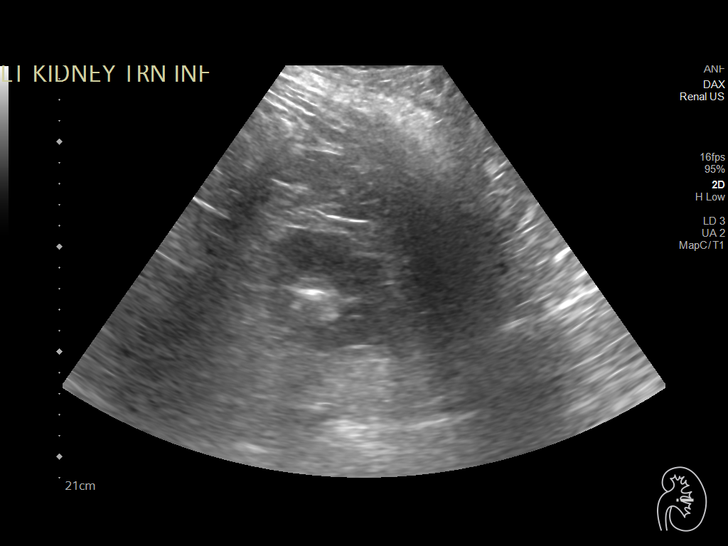
[im 23/34]
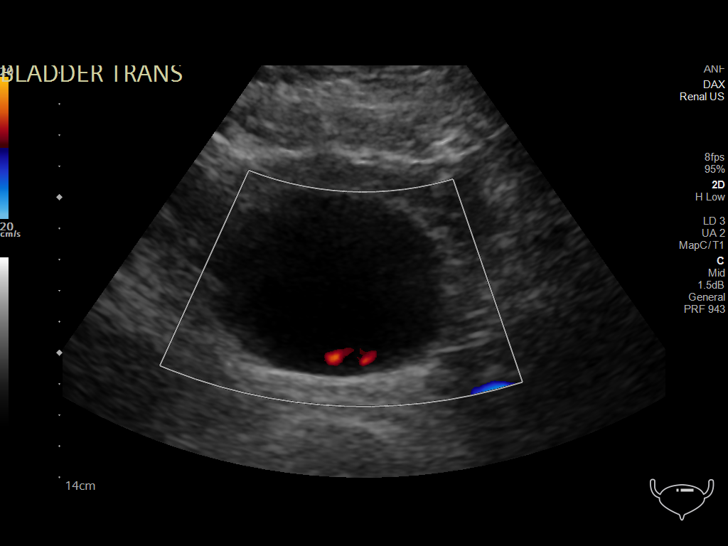
[im 25/34]
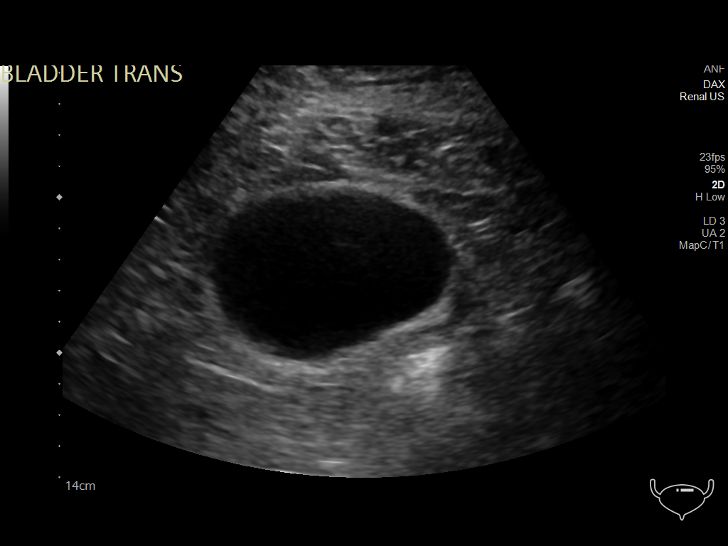
[im 28/34]
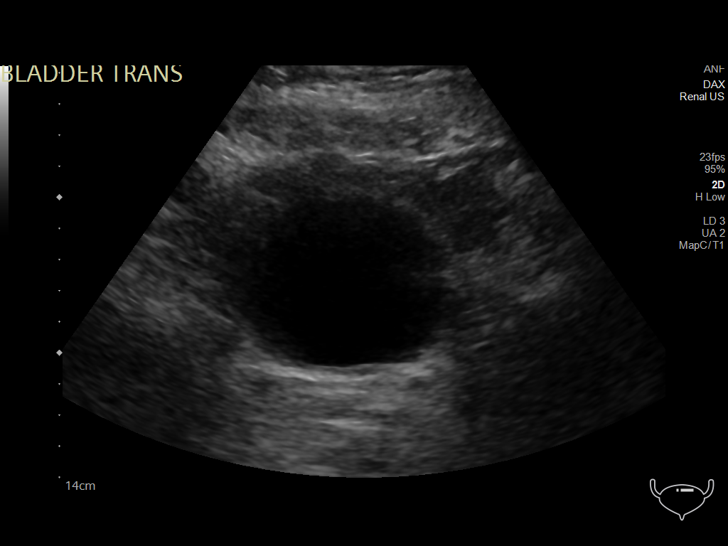
[im 31/34]
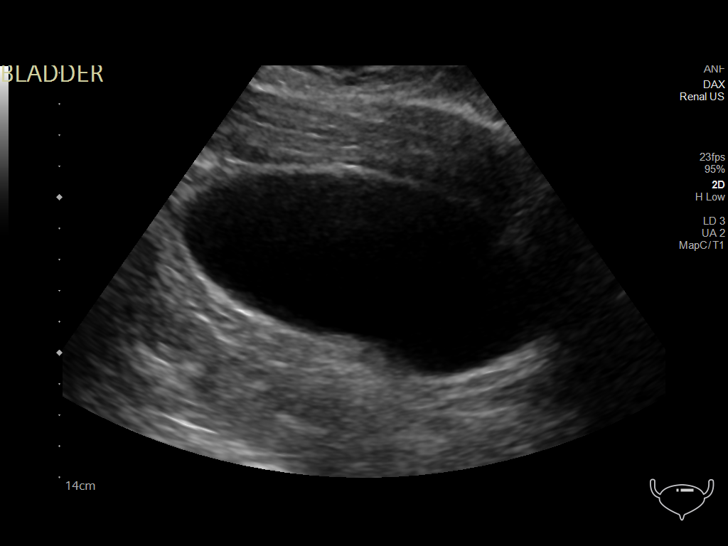
[im 34/34]
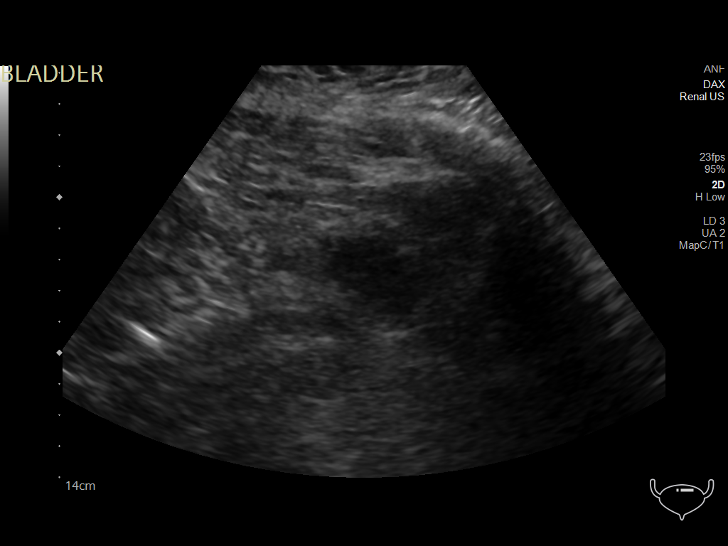

[14 of 25 positions shown; findings below may reference images not displayed]

FINDINGS: Right Kidney:

Renal measurements: 14.3 x 7.4 x 6.2 cm = volume: 345 mL.
Echogenicity within normal limits. No mass or hydronephrosis
visualized.

Left Kidney:

Renal measurements: 16.0 x 7.5 x 8.0 cm = volume: 501 mL.
Echogenicity within normal limits. A 12 mm shadowing stone present
near the lower pole of the left kidney. No obstruction. No other
focal lesions.

Bladder:

Appears normal for degree of bladder distention.

Other:

None.
IMPRESSION: 1. Nonobstructing 12 mm stone at the lower pole of the left kidney.
2. Otherwise normal sonographic appearance of the kidneys
bilaterally.

## 2023-03-07 ENCOUNTER — Other Ambulatory Visit (HOSPITAL_COMMUNITY): Payer: Self-pay

## 2023-03-07 ENCOUNTER — Other Ambulatory Visit: Payer: Self-pay

## 2023-03-14 ENCOUNTER — Other Ambulatory Visit (HOSPITAL_COMMUNITY): Payer: Self-pay

## 2023-03-14 DIAGNOSIS — F902 Attention-deficit hyperactivity disorder, combined type: Secondary | ICD-10-CM | POA: Diagnosis not present

## 2023-03-14 DIAGNOSIS — F3342 Major depressive disorder, recurrent, in full remission: Secondary | ICD-10-CM | POA: Diagnosis not present

## 2023-03-14 MED ORDER — METHYLPHENIDATE HCL ER (OSM) 36 MG PO TBCR
36.0000 mg | EXTENDED_RELEASE_TABLET | Freq: Every morning | ORAL | 0 refills | Status: AC
  Filled 2023-05-23: qty 30, 30d supply, fill #0

## 2023-03-14 MED ORDER — METHYLPHENIDATE HCL ER (OSM) 36 MG PO TBCR
36.0000 mg | EXTENDED_RELEASE_TABLET | Freq: Every morning | ORAL | 0 refills | Status: AC
  Filled 2023-04-11: qty 30, 30d supply, fill #0

## 2023-03-16 ENCOUNTER — Other Ambulatory Visit (HOSPITAL_COMMUNITY): Payer: Self-pay

## 2023-03-27 ENCOUNTER — Other Ambulatory Visit (HOSPITAL_COMMUNITY): Payer: Self-pay

## 2023-03-28 ENCOUNTER — Other Ambulatory Visit: Payer: Self-pay

## 2023-03-29 ENCOUNTER — Other Ambulatory Visit (HOSPITAL_COMMUNITY): Payer: Self-pay

## 2023-03-29 DIAGNOSIS — N1831 Chronic kidney disease, stage 3a: Secondary | ICD-10-CM | POA: Diagnosis not present

## 2023-03-29 DIAGNOSIS — E782 Mixed hyperlipidemia: Secondary | ICD-10-CM | POA: Diagnosis not present

## 2023-03-29 DIAGNOSIS — Z1331 Encounter for screening for depression: Secondary | ICD-10-CM | POA: Diagnosis not present

## 2023-03-29 DIAGNOSIS — I10 Essential (primary) hypertension: Secondary | ICD-10-CM | POA: Diagnosis not present

## 2023-03-29 DIAGNOSIS — Z6841 Body Mass Index (BMI) 40.0 and over, adult: Secondary | ICD-10-CM | POA: Diagnosis not present

## 2023-03-29 DIAGNOSIS — E66813 Obesity, class 3: Secondary | ICD-10-CM | POA: Diagnosis not present

## 2023-03-29 MED ORDER — BISOPROLOL FUMARATE 5 MG PO TABS
5.0000 mg | ORAL_TABLET | Freq: Every day | ORAL | 3 refills | Status: AC
  Filled 2023-03-29: qty 90, 90d supply, fill #0
  Filled 2023-04-29 – 2023-07-03 (×2): qty 90, 90d supply, fill #1
  Filled 2023-10-02: qty 90, 90d supply, fill #2

## 2023-03-29 MED ORDER — ZEPBOUND 2.5 MG/0.5ML ~~LOC~~ SOAJ
2.5000 mg | SUBCUTANEOUS | 1 refills | Status: DC
  Filled 2023-03-29 – 2023-04-11 (×2): qty 2, 28d supply, fill #0
  Filled 2023-05-04: qty 2, 28d supply, fill #1

## 2023-03-29 MED ORDER — LISINOPRIL 20 MG PO TABS
20.0000 mg | ORAL_TABLET | Freq: Every day | ORAL | 3 refills | Status: AC
  Filled 2023-03-29 – 2023-04-29 (×2): qty 90, 90d supply, fill #0
  Filled 2023-10-02: qty 90, 90d supply, fill #1

## 2023-03-29 MED ORDER — FENOFIBRATE 160 MG PO TABS
160.0000 mg | ORAL_TABLET | Freq: Every day | ORAL | 3 refills | Status: DC
  Filled 2023-03-29: qty 90, 90d supply, fill #0
  Filled 2023-06-26: qty 90, 90d supply, fill #1
  Filled 2023-09-28: qty 90, 90d supply, fill #2
  Filled 2023-12-27: qty 90, 90d supply, fill #3

## 2023-03-30 ENCOUNTER — Other Ambulatory Visit: Payer: Self-pay

## 2023-03-30 ENCOUNTER — Other Ambulatory Visit (HOSPITAL_COMMUNITY): Payer: Self-pay

## 2023-04-01 ENCOUNTER — Other Ambulatory Visit (HOSPITAL_COMMUNITY): Payer: Self-pay

## 2023-04-11 ENCOUNTER — Other Ambulatory Visit: Payer: Self-pay

## 2023-04-11 ENCOUNTER — Other Ambulatory Visit (HOSPITAL_COMMUNITY): Payer: Self-pay

## 2023-04-13 ENCOUNTER — Other Ambulatory Visit (HOSPITAL_COMMUNITY): Payer: Self-pay

## 2023-04-13 MED ORDER — TRAMADOL HCL 50 MG PO TABS
100.0000 mg | ORAL_TABLET | Freq: Three times a day (TID) | ORAL | 2 refills | Status: AC
  Filled 2023-04-13 – 2023-05-23 (×2): qty 180, 30d supply, fill #0
  Filled 2023-06-26: qty 180, 30d supply, fill #1

## 2023-04-13 MED ORDER — HYDROCODONE-ACETAMINOPHEN 5-325 MG PO TABS
ORAL_TABLET | ORAL | 0 refills | Status: AC
  Filled 2023-04-29: qty 15, 15d supply, fill #0

## 2023-04-13 MED ORDER — TIZANIDINE HCL 4 MG PO TABS
4.0000 mg | ORAL_TABLET | Freq: Three times a day (TID) | ORAL | 2 refills | Status: AC
  Filled 2023-04-13 – 2023-04-29 (×2): qty 90, 30d supply, fill #0
  Filled 2023-05-23: qty 90, 30d supply, fill #1
  Filled 2023-06-26: qty 90, 30d supply, fill #2

## 2023-04-13 MED ORDER — GABAPENTIN 300 MG PO CAPS
300.0000 mg | ORAL_CAPSULE | Freq: Three times a day (TID) | ORAL | 0 refills | Status: DC
  Filled 2023-04-13 – 2023-06-26 (×2): qty 270, 90d supply, fill #0

## 2023-04-29 ENCOUNTER — Other Ambulatory Visit (HOSPITAL_COMMUNITY): Payer: Self-pay

## 2023-04-29 ENCOUNTER — Other Ambulatory Visit: Payer: Self-pay

## 2023-04-30 ENCOUNTER — Other Ambulatory Visit (HOSPITAL_COMMUNITY): Payer: Self-pay

## 2023-05-05 ENCOUNTER — Other Ambulatory Visit (HOSPITAL_COMMUNITY): Payer: Self-pay

## 2023-05-06 ENCOUNTER — Other Ambulatory Visit (HOSPITAL_COMMUNITY): Payer: Self-pay

## 2023-05-10 DIAGNOSIS — Z981 Arthrodesis status: Secondary | ICD-10-CM | POA: Diagnosis not present

## 2023-05-23 ENCOUNTER — Other Ambulatory Visit (HOSPITAL_COMMUNITY): Payer: Self-pay

## 2023-06-02 ENCOUNTER — Other Ambulatory Visit (HOSPITAL_COMMUNITY): Payer: Self-pay

## 2023-06-02 DIAGNOSIS — F3342 Major depressive disorder, recurrent, in full remission: Secondary | ICD-10-CM | POA: Diagnosis not present

## 2023-06-02 DIAGNOSIS — F902 Attention-deficit hyperactivity disorder, combined type: Secondary | ICD-10-CM | POA: Diagnosis not present

## 2023-06-03 ENCOUNTER — Other Ambulatory Visit (HOSPITAL_COMMUNITY): Payer: Self-pay

## 2023-06-03 ENCOUNTER — Other Ambulatory Visit: Payer: Self-pay

## 2023-06-03 MED ORDER — METHYLPHENIDATE HCL ER (OSM) 36 MG PO TBCR
36.0000 mg | EXTENDED_RELEASE_TABLET | Freq: Every morning | ORAL | 0 refills | Status: AC
  Filled 2023-06-03 – 2023-06-26 (×2): qty 30, 30d supply, fill #0

## 2023-06-03 MED ORDER — METHYLPHENIDATE HCL ER (OSM) 36 MG PO TBCR: 36.0000 mg | EXTENDED_RELEASE_TABLET | Freq: Every morning | ORAL | 0 refills | Status: AC

## 2023-06-03 MED ORDER — BUPROPION HCL ER (XL) 300 MG PO TB24
300.0000 mg | ORAL_TABLET | Freq: Every morning | ORAL | 0 refills | Status: AC
  Filled 2023-06-03: qty 90, 90d supply, fill #0

## 2023-06-27 ENCOUNTER — Other Ambulatory Visit (HOSPITAL_COMMUNITY): Payer: Self-pay

## 2023-06-27 ENCOUNTER — Other Ambulatory Visit: Payer: Self-pay

## 2023-07-05 ENCOUNTER — Other Ambulatory Visit: Payer: Self-pay

## 2023-07-05 ENCOUNTER — Other Ambulatory Visit (HOSPITAL_COMMUNITY): Payer: Self-pay

## 2023-07-05 MED ORDER — TIRZEPATIDE-WEIGHT MANAGEMENT 2.5 MG/0.5ML ~~LOC~~ SOAJ
SUBCUTANEOUS | 1 refills | Status: AC
  Filled 2023-07-05: qty 2, 28d supply, fill #0
  Filled 2023-08-05: qty 2, 28d supply, fill #1

## 2023-07-07 ENCOUNTER — Other Ambulatory Visit (HOSPITAL_COMMUNITY): Payer: Self-pay

## 2023-07-07 MED ORDER — TRAMADOL HCL 50 MG PO TABS
50.0000 mg | ORAL_TABLET | Freq: Three times a day (TID) | ORAL | 2 refills | Status: AC | PRN
Start: 2023-07-07 — End: ?
  Filled 2023-07-07 – 2023-08-23 (×2): qty 180, 30d supply, fill #0
  Filled 2023-12-11: qty 180, 30d supply, fill #1

## 2023-07-07 MED ORDER — TIZANIDINE HCL 4 MG PO TABS
4.0000 mg | ORAL_TABLET | Freq: Three times a day (TID) | ORAL | 2 refills | Status: AC | PRN
  Filled 2023-07-07 – 2023-08-23 (×2): qty 90, 30d supply, fill #0

## 2023-07-07 MED ORDER — GABAPENTIN 300 MG PO CAPS
300.0000 mg | ORAL_CAPSULE | Freq: Three times a day (TID) | ORAL | 0 refills | Status: DC
Start: 2023-07-07 — End: 2023-10-04
  Filled 2023-07-07 – 2023-09-28 (×2): qty 270, 90d supply, fill #0

## 2023-07-07 MED ORDER — HYDROCODONE-ACETAMINOPHEN 5-325 MG PO TABS
1.0000 | ORAL_TABLET | Freq: Every day | ORAL | 0 refills | Status: AC | PRN
  Filled 2023-07-26: qty 15, 15d supply, fill #0

## 2023-07-07 MED ORDER — NALOXONE HCL 4 MG/0.1ML NA LIQD
NASAL | 0 refills | Status: AC
Start: 2023-07-07 — End: ?
  Filled 2023-07-07 – 2023-07-26 (×2): qty 2, 15d supply, fill #0

## 2023-07-13 ENCOUNTER — Other Ambulatory Visit (HOSPITAL_COMMUNITY): Payer: Self-pay

## 2023-07-26 ENCOUNTER — Other Ambulatory Visit: Payer: Self-pay

## 2023-07-26 ENCOUNTER — Other Ambulatory Visit (HOSPITAL_COMMUNITY): Payer: Self-pay

## 2023-07-27 ENCOUNTER — Other Ambulatory Visit: Payer: Self-pay

## 2023-07-27 ENCOUNTER — Other Ambulatory Visit (HOSPITAL_COMMUNITY): Payer: Self-pay

## 2023-08-05 ENCOUNTER — Other Ambulatory Visit (HOSPITAL_COMMUNITY): Payer: Self-pay

## 2023-08-05 ENCOUNTER — Other Ambulatory Visit: Payer: Self-pay

## 2023-08-22 ENCOUNTER — Other Ambulatory Visit (HOSPITAL_COMMUNITY): Payer: Self-pay

## 2023-08-22 DIAGNOSIS — F902 Attention-deficit hyperactivity disorder, combined type: Secondary | ICD-10-CM | POA: Diagnosis not present

## 2023-08-22 DIAGNOSIS — F3342 Major depressive disorder, recurrent, in full remission: Secondary | ICD-10-CM | POA: Diagnosis not present

## 2023-08-22 MED ORDER — METHYLPHENIDATE HCL ER (OSM) 36 MG PO TBCR: 36.0000 mg | EXTENDED_RELEASE_TABLET | Freq: Every morning | ORAL | 0 refills | Status: AC

## 2023-08-22 MED ORDER — BUPROPION HCL ER (XL) 300 MG PO TB24
300.0000 mg | ORAL_TABLET | Freq: Every morning | ORAL | 0 refills | Status: AC
  Filled 2023-08-23: qty 90, 90d supply, fill #0

## 2023-08-22 MED ORDER — METHYLPHENIDATE HCL ER (OSM) 36 MG PO TBCR
36.0000 mg | EXTENDED_RELEASE_TABLET | Freq: Every morning | ORAL | 0 refills | Status: AC
  Filled 2023-08-22: qty 30, 30d supply, fill #0

## 2023-08-22 MED ORDER — METHYLPHENIDATE HCL ER (OSM) 36 MG PO TBCR
36.0000 mg | EXTENDED_RELEASE_TABLET | Freq: Every morning | ORAL | 0 refills | Status: AC
  Filled 2023-10-24: qty 30, 30d supply, fill #0

## 2023-08-23 ENCOUNTER — Other Ambulatory Visit (HOSPITAL_COMMUNITY): Payer: Self-pay

## 2023-08-23 ENCOUNTER — Other Ambulatory Visit: Payer: Self-pay

## 2023-08-25 DIAGNOSIS — G4733 Obstructive sleep apnea (adult) (pediatric): Secondary | ICD-10-CM | POA: Diagnosis not present

## 2023-09-01 ENCOUNTER — Other Ambulatory Visit: Payer: Self-pay

## 2023-09-01 ENCOUNTER — Other Ambulatory Visit (HOSPITAL_COMMUNITY): Payer: Self-pay

## 2023-09-01 MED ORDER — ZEPBOUND 2.5 MG/0.5ML ~~LOC~~ SOAJ
2.5000 mg | SUBCUTANEOUS | 1 refills | Status: DC
  Filled 2023-09-01: qty 2, 28d supply, fill #0
  Filled 2023-09-28: qty 2, 28d supply, fill #1

## 2023-09-19 ENCOUNTER — Other Ambulatory Visit (HOSPITAL_COMMUNITY): Payer: Self-pay

## 2023-09-28 ENCOUNTER — Other Ambulatory Visit (HOSPITAL_COMMUNITY): Payer: Self-pay

## 2023-09-28 ENCOUNTER — Other Ambulatory Visit: Payer: Self-pay

## 2023-09-28 MED ORDER — ZEPBOUND 5 MG/0.5ML ~~LOC~~ SOAJ
5.0000 mg | SUBCUTANEOUS | 1 refills | Status: AC
  Filled 2023-09-28: qty 2, 28d supply, fill #0

## 2023-09-29 ENCOUNTER — Other Ambulatory Visit: Payer: Self-pay

## 2023-09-30 ENCOUNTER — Other Ambulatory Visit (HOSPITAL_COMMUNITY): Payer: Self-pay

## 2023-10-04 ENCOUNTER — Other Ambulatory Visit (HOSPITAL_COMMUNITY): Payer: Self-pay

## 2023-10-04 DIAGNOSIS — Z5181 Encounter for therapeutic drug level monitoring: Secondary | ICD-10-CM | POA: Diagnosis not present

## 2023-10-04 DIAGNOSIS — Z79899 Other long term (current) drug therapy: Secondary | ICD-10-CM | POA: Diagnosis not present

## 2023-10-04 MED ORDER — GABAPENTIN 300 MG PO CAPS
300.0000 mg | ORAL_CAPSULE | Freq: Three times a day (TID) | ORAL | 0 refills | Status: DC
  Filled 2023-10-04 – 2023-12-27 (×2): qty 270, 90d supply, fill #0

## 2023-10-04 MED ORDER — TRAMADOL HCL 50 MG PO TABS
50.0000 mg | ORAL_TABLET | Freq: Three times a day (TID) | ORAL | 2 refills | Status: AC
  Filled 2023-10-04: qty 180, 60d supply, fill #0
  Filled 2023-10-24: qty 180, 30d supply, fill #0
  Filled 2024-03-19 (×2): qty 180, 30d supply, fill #1

## 2023-10-04 MED ORDER — TIZANIDINE HCL 4 MG PO TABS
4.0000 mg | ORAL_TABLET | Freq: Three times a day (TID) | ORAL | 2 refills | Status: AC | PRN
  Filled 2023-10-04 – 2023-10-24 (×2): qty 90, 30d supply, fill #0
  Filled 2024-01-08: qty 90, 30d supply, fill #1

## 2023-10-13 ENCOUNTER — Other Ambulatory Visit (HOSPITAL_COMMUNITY): Payer: Self-pay

## 2023-10-24 ENCOUNTER — Other Ambulatory Visit (HOSPITAL_COMMUNITY): Payer: Self-pay

## 2023-10-26 ENCOUNTER — Other Ambulatory Visit (HOSPITAL_COMMUNITY): Payer: Self-pay

## 2023-10-26 MED ORDER — ZEPBOUND 7.5 MG/0.5ML ~~LOC~~ SOAJ
7.5000 mg | SUBCUTANEOUS | 1 refills | Status: DC
  Filled 2023-10-26: qty 2, 28d supply, fill #0
  Filled 2023-11-28: qty 2, 28d supply, fill #1

## 2023-11-08 ENCOUNTER — Other Ambulatory Visit (HOSPITAL_COMMUNITY): Payer: Self-pay

## 2023-11-08 DIAGNOSIS — F902 Attention-deficit hyperactivity disorder, combined type: Secondary | ICD-10-CM | POA: Diagnosis not present

## 2023-11-08 DIAGNOSIS — F3342 Major depressive disorder, recurrent, in full remission: Secondary | ICD-10-CM | POA: Diagnosis not present

## 2023-11-08 MED ORDER — BUPROPION HCL ER (XL) 300 MG PO TB24
300.0000 mg | ORAL_TABLET | Freq: Every morning | ORAL | 0 refills | Status: AC
  Filled 2023-11-28: qty 90, 90d supply, fill #0

## 2023-11-08 MED ORDER — METHYLPHENIDATE HCL ER (OSM) 36 MG PO TBCR
36.0000 mg | EXTENDED_RELEASE_TABLET | Freq: Every morning | ORAL | 0 refills | Status: AC
  Filled 2024-04-01: qty 30, 30d supply, fill #0

## 2023-11-08 MED ORDER — METHYLPHENIDATE HCL ER (OSM) 36 MG PO TBCR: 36.0000 mg | EXTENDED_RELEASE_TABLET | Freq: Every morning | ORAL | 0 refills | Status: AC

## 2023-11-08 MED ORDER — METHYLPHENIDATE HCL ER (OSM) 36 MG PO TBCR
36.0000 mg | EXTENDED_RELEASE_TABLET | Freq: Every morning | ORAL | 0 refills | Status: AC
  Filled 2023-11-28: qty 30, 30d supply, fill #0

## 2023-11-09 ENCOUNTER — Other Ambulatory Visit (HOSPITAL_COMMUNITY): Payer: Self-pay

## 2023-11-11 ENCOUNTER — Other Ambulatory Visit (HOSPITAL_COMMUNITY): Payer: Self-pay

## 2023-11-14 ENCOUNTER — Other Ambulatory Visit (HOSPITAL_COMMUNITY): Payer: Self-pay

## 2023-11-28 ENCOUNTER — Other Ambulatory Visit: Payer: Self-pay

## 2023-11-28 ENCOUNTER — Other Ambulatory Visit (HOSPITAL_COMMUNITY): Payer: Self-pay

## 2023-12-11 ENCOUNTER — Other Ambulatory Visit (HOSPITAL_COMMUNITY): Payer: Self-pay

## 2023-12-12 ENCOUNTER — Other Ambulatory Visit: Payer: Self-pay

## 2023-12-12 ENCOUNTER — Other Ambulatory Visit (HOSPITAL_COMMUNITY): Payer: Self-pay

## 2023-12-12 MED ORDER — ALLOPURINOL 100 MG PO TABS
100.0000 mg | ORAL_TABLET | Freq: Every day | ORAL | 1 refills | Status: AC
  Filled 2023-12-12 – 2023-12-23 (×2): qty 60, 60d supply, fill #0
  Filled 2023-12-27: qty 30, 30d supply, fill #0
  Filled 2024-02-19: qty 60, 60d supply, fill #0
  Filled 2024-06-04: qty 60, 60d supply, fill #1

## 2023-12-22 DIAGNOSIS — Z0000 Encounter for general adult medical examination without abnormal findings: Secondary | ICD-10-CM | POA: Diagnosis not present

## 2023-12-22 DIAGNOSIS — I10 Essential (primary) hypertension: Secondary | ICD-10-CM | POA: Diagnosis not present

## 2023-12-22 DIAGNOSIS — E782 Mixed hyperlipidemia: Secondary | ICD-10-CM | POA: Diagnosis not present

## 2023-12-22 DIAGNOSIS — R7303 Prediabetes: Secondary | ICD-10-CM | POA: Diagnosis not present

## 2023-12-23 ENCOUNTER — Other Ambulatory Visit (HOSPITAL_COMMUNITY): Payer: Self-pay

## 2023-12-23 ENCOUNTER — Other Ambulatory Visit: Payer: Self-pay

## 2023-12-23 MED ORDER — ZEPBOUND 7.5 MG/0.5ML ~~LOC~~ SOAJ
7.5000 mg | SUBCUTANEOUS | 1 refills | Status: AC
  Filled 2023-12-23: qty 2, 28d supply, fill #0

## 2023-12-27 ENCOUNTER — Other Ambulatory Visit: Payer: Self-pay

## 2023-12-27 ENCOUNTER — Other Ambulatory Visit (HOSPITAL_COMMUNITY): Payer: Self-pay

## 2024-01-05 ENCOUNTER — Other Ambulatory Visit: Payer: Self-pay

## 2024-01-05 ENCOUNTER — Other Ambulatory Visit (HOSPITAL_COMMUNITY): Payer: Self-pay

## 2024-01-05 MED ORDER — LISINOPRIL 20 MG PO TABS
20.0000 mg | ORAL_TABLET | Freq: Every day | ORAL | 3 refills | Status: AC
  Filled 2024-01-05: qty 90, 90d supply, fill #0
  Filled 2024-04-06: qty 90, 90d supply, fill #1

## 2024-01-05 MED ORDER — FENOFIBRATE 160 MG PO TABS
160.0000 mg | ORAL_TABLET | Freq: Every day | ORAL | 3 refills | Status: AC
  Filled 2024-01-05 – 2024-04-01 (×3): qty 90, 90d supply, fill #0

## 2024-01-05 MED ORDER — ZEPBOUND 10 MG/0.5ML ~~LOC~~ SOAJ
10.0000 mg | SUBCUTANEOUS | 3 refills | Status: AC
  Filled 2024-01-05: qty 2, 28d supply, fill #0
  Filled 2024-02-19: qty 2, 28d supply, fill #1
  Filled 2024-03-19: qty 2, 28d supply, fill #2
  Filled 2024-04-14: qty 2, 28d supply, fill #3
  Filled 2024-05-11: qty 2, 28d supply, fill #4
  Filled 2024-06-06: qty 2, 28d supply, fill #5

## 2024-01-05 MED ORDER — BISOPROLOL FUMARATE 5 MG PO TABS
5.0000 mg | ORAL_TABLET | Freq: Every day | ORAL | 3 refills | Status: AC
  Filled 2024-01-05: qty 90, 90d supply, fill #0
  Filled 2024-04-06: qty 90, 90d supply, fill #1

## 2024-01-05 MED ORDER — EZETIMIBE 10 MG PO TABS
10.0000 mg | ORAL_TABLET | Freq: Every day | ORAL | 3 refills | Status: AC
  Filled 2024-01-05: qty 90, 90d supply, fill #0
  Filled 2024-06-04: qty 90, 90d supply, fill #1

## 2024-01-18 ENCOUNTER — Other Ambulatory Visit (HOSPITAL_COMMUNITY): Payer: Self-pay

## 2024-01-18 MED ORDER — HYDROCODONE-ACETAMINOPHEN 5-325 MG PO TABS
1.0000 | ORAL_TABLET | Freq: Every day | ORAL | 0 refills | Status: AC | PRN
  Filled 2024-02-27: qty 15, 15d supply, fill #0

## 2024-01-18 MED ORDER — TIZANIDINE HCL 4 MG PO TABS
4.0000 mg | ORAL_TABLET | Freq: Three times a day (TID) | ORAL | 2 refills | Status: AC | PRN
  Filled 2024-01-18 – 2024-02-19 (×2): qty 90, 30d supply, fill #0
  Filled 2024-06-04: qty 90, 30d supply, fill #1

## 2024-01-18 MED ORDER — GABAPENTIN 300 MG PO CAPS
300.0000 mg | ORAL_CAPSULE | Freq: Three times a day (TID) | ORAL | 0 refills | Status: DC
  Filled 2024-01-18 – 2024-04-01 (×3): qty 270, 90d supply, fill #0

## 2024-01-18 MED ORDER — TRAMADOL HCL 50 MG PO TABS
50.0000 mg | ORAL_TABLET | Freq: Three times a day (TID) | ORAL | 2 refills | Status: DC | PRN
  Filled 2024-01-18: qty 180, 60d supply, fill #0
  Filled 2024-02-19 – 2024-03-13 (×2): qty 180, 60d supply, fill #1
  Filled 2024-04-25: qty 180, 30d supply, fill #1

## 2024-01-30 ENCOUNTER — Other Ambulatory Visit (HOSPITAL_COMMUNITY): Payer: Self-pay

## 2024-01-30 DIAGNOSIS — F902 Attention-deficit hyperactivity disorder, combined type: Secondary | ICD-10-CM | POA: Diagnosis not present

## 2024-01-30 DIAGNOSIS — F3342 Major depressive disorder, recurrent, in full remission: Secondary | ICD-10-CM | POA: Diagnosis not present

## 2024-01-30 MED ORDER — METHYLPHENIDATE HCL ER (OSM) 36 MG PO TBCR
36.0000 mg | EXTENDED_RELEASE_TABLET | Freq: Every morning | ORAL | 0 refills | Status: AC
  Filled 2024-02-19 – 2024-02-27 (×2): qty 30, 30d supply, fill #0

## 2024-01-30 MED ORDER — METHYLPHENIDATE HCL ER (OSM) 36 MG PO TBCR
36.0000 mg | EXTENDED_RELEASE_TABLET | Freq: Every morning | ORAL | 0 refills | Status: AC
  Filled 2024-01-30: qty 30, 30d supply, fill #0

## 2024-01-30 MED ORDER — BUPROPION HCL ER (XL) 300 MG PO TB24
300.0000 mg | ORAL_TABLET | Freq: Every morning | ORAL | 0 refills | Status: AC
  Filled 2024-01-30 – 2024-04-01 (×3): qty 90, 90d supply, fill #0

## 2024-01-30 MED ORDER — METHYLPHENIDATE HCL ER (OSM) 36 MG PO TBCR
36.0000 mg | EXTENDED_RELEASE_TABLET | Freq: Every morning | ORAL | 0 refills | Status: AC
  Filled 2024-02-27: qty 30, 30d supply, fill #0

## 2024-02-09 ENCOUNTER — Other Ambulatory Visit (HOSPITAL_COMMUNITY): Payer: Self-pay

## 2024-02-09 MED ORDER — FLUZONE 0.5 ML IM SUSY
0.5000 mL | PREFILLED_SYRINGE | Freq: Once | INTRAMUSCULAR | 0 refills | Status: AC
  Filled 2024-02-09: qty 0.5, 1d supply, fill #0

## 2024-02-15 DIAGNOSIS — M5417 Radiculopathy, lumbosacral region: Secondary | ICD-10-CM | POA: Diagnosis not present

## 2024-02-19 ENCOUNTER — Other Ambulatory Visit (HOSPITAL_COMMUNITY): Payer: Self-pay

## 2024-02-20 ENCOUNTER — Other Ambulatory Visit (HOSPITAL_COMMUNITY): Payer: Self-pay

## 2024-02-20 ENCOUNTER — Other Ambulatory Visit: Payer: Self-pay

## 2024-02-22 ENCOUNTER — Other Ambulatory Visit (HOSPITAL_COMMUNITY): Payer: Self-pay

## 2024-02-24 ENCOUNTER — Other Ambulatory Visit (HOSPITAL_COMMUNITY): Payer: Self-pay

## 2024-02-27 ENCOUNTER — Other Ambulatory Visit (HOSPITAL_COMMUNITY): Payer: Self-pay

## 2024-03-13 ENCOUNTER — Other Ambulatory Visit: Payer: Self-pay

## 2024-03-19 ENCOUNTER — Other Ambulatory Visit: Payer: Self-pay

## 2024-03-19 ENCOUNTER — Other Ambulatory Visit (HOSPITAL_COMMUNITY): Payer: Self-pay

## 2024-03-25 ENCOUNTER — Other Ambulatory Visit (HOSPITAL_COMMUNITY): Payer: Self-pay

## 2024-03-25 MED ORDER — ALLOPURINOL 100 MG PO TABS
200.0000 mg | ORAL_TABLET | Freq: Every day | ORAL | 0 refills | Status: AC
  Filled 2024-03-25 – 2024-04-23 (×5): qty 180, 90d supply, fill #0

## 2024-03-26 ENCOUNTER — Other Ambulatory Visit (HOSPITAL_COMMUNITY): Payer: Self-pay

## 2024-03-26 ENCOUNTER — Other Ambulatory Visit: Payer: Self-pay

## 2024-03-27 ENCOUNTER — Other Ambulatory Visit (HOSPITAL_COMMUNITY): Payer: Self-pay

## 2024-03-27 ENCOUNTER — Encounter: Payer: Self-pay | Admitting: Pharmacist

## 2024-03-27 ENCOUNTER — Other Ambulatory Visit: Payer: Self-pay

## 2024-03-28 ENCOUNTER — Encounter: Payer: Self-pay | Admitting: Pharmacist

## 2024-03-28 ENCOUNTER — Other Ambulatory Visit: Payer: Self-pay

## 2024-03-30 ENCOUNTER — Other Ambulatory Visit: Payer: Self-pay

## 2024-04-02 ENCOUNTER — Other Ambulatory Visit (HOSPITAL_COMMUNITY): Payer: Self-pay

## 2024-04-02 ENCOUNTER — Other Ambulatory Visit: Payer: Self-pay

## 2024-04-04 ENCOUNTER — Other Ambulatory Visit (HOSPITAL_COMMUNITY): Payer: Self-pay

## 2024-04-04 DIAGNOSIS — N200 Calculus of kidney: Secondary | ICD-10-CM | POA: Diagnosis not present

## 2024-04-04 DIAGNOSIS — R82994 Hypercalciuria: Secondary | ICD-10-CM | POA: Diagnosis not present

## 2024-04-04 DIAGNOSIS — R82993 Hyperuricosuria: Secondary | ICD-10-CM | POA: Diagnosis not present

## 2024-04-04 MED ORDER — INDAPAMIDE 2.5 MG PO TABS
2.5000 mg | ORAL_TABLET | ORAL | 3 refills | Status: AC
  Filled 2024-04-04: qty 90, 90d supply, fill #0

## 2024-04-04 MED ORDER — ALLOPURINOL 100 MG PO TABS
200.0000 mg | ORAL_TABLET | Freq: Every day | ORAL | 3 refills | Status: AC
  Filled 2024-04-04: qty 180, 90d supply, fill #0

## 2024-04-05 ENCOUNTER — Other Ambulatory Visit: Payer: Self-pay

## 2024-04-05 ENCOUNTER — Other Ambulatory Visit (HOSPITAL_COMMUNITY): Payer: Self-pay

## 2024-04-06 ENCOUNTER — Other Ambulatory Visit: Payer: Self-pay

## 2024-04-06 ENCOUNTER — Other Ambulatory Visit (HOSPITAL_COMMUNITY): Payer: Self-pay

## 2024-04-08 ENCOUNTER — Other Ambulatory Visit (HOSPITAL_COMMUNITY): Payer: Self-pay

## 2024-04-09 ENCOUNTER — Other Ambulatory Visit: Payer: Self-pay

## 2024-04-10 ENCOUNTER — Other Ambulatory Visit: Payer: Self-pay

## 2024-04-10 ENCOUNTER — Encounter: Payer: Self-pay | Admitting: Pharmacist

## 2024-04-13 ENCOUNTER — Other Ambulatory Visit: Payer: Self-pay

## 2024-04-14 ENCOUNTER — Other Ambulatory Visit (HOSPITAL_COMMUNITY): Payer: Self-pay

## 2024-04-23 ENCOUNTER — Ambulatory Visit (HOSPITAL_COMMUNITY)
Admission: RE | Admit: 2024-04-23 | Discharge: 2024-04-23 | Disposition: A | Discharge status: Home / Self Care | Payer: Self-pay | Source: Ambulatory Visit | Attending: Occupational Medicine | Admitting: Occupational Medicine

## 2024-04-23 ENCOUNTER — Other Ambulatory Visit (HOSPITAL_COMMUNITY): Payer: Self-pay

## 2024-04-23 ENCOUNTER — Other Ambulatory Visit (HOSPITAL_COMMUNITY): Payer: Self-pay | Admitting: Occupational Medicine

## 2024-04-23 ENCOUNTER — Other Ambulatory Visit: Payer: Self-pay

## 2024-04-23 DIAGNOSIS — R0789 Other chest pain: Secondary | ICD-10-CM | POA: Diagnosis present

## 2024-04-25 ENCOUNTER — Other Ambulatory Visit: Payer: Self-pay

## 2024-05-09 ENCOUNTER — Other Ambulatory Visit (HOSPITAL_COMMUNITY): Payer: Self-pay

## 2024-05-09 MED ORDER — HYDROCODONE-ACETAMINOPHEN 5-325 MG PO TABS
1.0000 | ORAL_TABLET | Freq: Every day | ORAL | 0 refills | Status: AC | PRN
  Filled 2024-06-06: qty 15, 15d supply, fill #0

## 2024-05-09 MED ORDER — TRAMADOL HCL 50 MG PO TABS
100.0000 mg | ORAL_TABLET | Freq: Three times a day (TID) | ORAL | 2 refills | Status: AC
  Filled 2024-06-04: qty 180, 30d supply, fill #0

## 2024-05-09 MED ORDER — GABAPENTIN 300 MG PO CAPS
300.0000 mg | ORAL_CAPSULE | Freq: Three times a day (TID) | ORAL | 0 refills | Status: AC
  Filled 2024-06-04: qty 270, 90d supply, fill #0

## 2024-05-11 ENCOUNTER — Other Ambulatory Visit: Payer: Self-pay

## 2024-05-22 ENCOUNTER — Other Ambulatory Visit (HOSPITAL_COMMUNITY): Payer: Self-pay

## 2024-05-22 MED ORDER — METHYLPHENIDATE HCL 5 MG PO TABS
5.0000 mg | ORAL_TABLET | Freq: Every day | ORAL | 0 refills | Status: AC
  Filled 2024-05-22: qty 30, 30d supply, fill #0

## 2024-05-22 MED ORDER — METHYLPHENIDATE HCL ER (OSM) 36 MG PO TBCR
36.0000 mg | EXTENDED_RELEASE_TABLET | Freq: Every morning | ORAL | 0 refills | Status: AC
  Filled 2024-06-04: qty 30, 30d supply, fill #0

## 2024-05-22 MED ORDER — METHYLPHENIDATE HCL ER (OSM) 36 MG PO TBCR
36.0000 mg | EXTENDED_RELEASE_TABLET | Freq: Every morning | ORAL | 0 refills | Status: AC
  Filled 2024-05-22: qty 30, 30d supply, fill #0

## 2024-05-22 MED ORDER — METHYLPHENIDATE HCL ER (OSM) 36 MG PO TBCR: 36.0000 mg | EXTENDED_RELEASE_TABLET | Freq: Every morning | ORAL | 0 refills | Status: AC

## 2024-05-22 MED ORDER — BUPROPION HCL ER (XL) 300 MG PO TB24
300.0000 mg | ORAL_TABLET | Freq: Every morning | ORAL | 0 refills | Status: AC
  Filled 2024-05-22: qty 90, 90d supply, fill #0

## 2024-06-05 ENCOUNTER — Other Ambulatory Visit (HOSPITAL_COMMUNITY): Payer: Self-pay

## 2024-06-06 ENCOUNTER — Other Ambulatory Visit: Payer: Self-pay

## 2024-06-06 ENCOUNTER — Other Ambulatory Visit (HOSPITAL_COMMUNITY): Payer: Self-pay
